# Patient Record
Sex: Male | Born: 1960 | Race: Asian | Hispanic: No | Marital: Married | Smoking: Never smoker
Health system: Southern US, Community
[De-identification: ages and names within clinical notes are randomized; demographics above are authoritative.]

## PROBLEM LIST (undated history)

## (undated) HISTORY — PX: APPENDECTOMY: SHX54

---

## 2015-01-16 ENCOUNTER — Encounter (HOSPITAL_COMMUNITY): Payer: Self-pay | Admitting: Emergency Medicine

## 2015-01-16 ENCOUNTER — Emergency Department (INDEPENDENT_AMBULATORY_CARE_PROVIDER_SITE_OTHER): Payer: PRIVATE HEALTH INSURANCE

## 2015-01-16 ENCOUNTER — Emergency Department (INDEPENDENT_AMBULATORY_CARE_PROVIDER_SITE_OTHER)
Admission: EM | Admit: 2015-01-16 | Discharge: 2015-01-16 | Disposition: A | Discharge status: Home / Self Care | Payer: PRIVATE HEALTH INSURANCE | Source: Home / Self Care | Attending: Emergency Medicine | Admitting: Emergency Medicine

## 2015-01-16 DIAGNOSIS — B349 Viral infection, unspecified: Secondary | ICD-10-CM | POA: Diagnosis not present

## 2015-01-16 LAB — POCT RAPID STREP A: STREPTOCOCCUS, GROUP A SCREEN (DIRECT): NEGATIVE

## 2015-01-16 MED ORDER — HYDROCODONE-HOMATROPINE 5-1.5 MG/5ML PO SYRP: 5.0000 mL | ORAL_SOLUTION | Freq: Every evening | ORAL | Status: DC | PRN

## 2015-01-16 NOTE — ED Provider Notes (Signed)
CSN: [id number]     Arrival date & time 01/16/15  1313 History   First MD Initiated Contact with Patient 01/16/15 1419     Chief Complaint  Patient presents with  . Sore Throat  . Generalized Body Aches  . Headache  . Cough   (Consider location/radiation/quality/duration/timing/severity/associated sxs/prior Treatment) HPI  He is a 54 year old man here for evaluation of cough and sore throat. He states his symptoms started about 3 days ago. He reports subjective fevers at night. He also describes a sore throat, cough, headache, and body aches. He denies any shortness of breath. His appetite is good. No nausea or vomiting. He has tried taking Aleve for the sore throat without improvement. No history of asthma or allergies.  History reviewed. No pertinent past medical history. Past Surgical History  Procedure Laterality Date  . Appendectomy     History reviewed. No pertinent family history. Social History  Substance Use Topics  . Smoking status: Never Smoker   . Smokeless tobacco: Never Used  . Alcohol Use: No    Review of Systems As in history of present illness. Allergies  Review of patient's allergies indicates no known allergies.  Home Medications   Prior to Admission medications   Medication Sig Start Date End Date Taking? Authorizing Provider  HYDROcodone-homatropine (HYCODAN) 5-1.5 MG/5ML syrup Take 5 mLs by mouth at bedtime as needed for cough. 01/16/15   Charm Rings, MD  Naproxen Sodium (ALEVE PO) Take by mouth.   Yes Historical Provider, MD   Meds Ordered and Administered this Visit  Medications - No data to display  BP 149/88 mmHg  Pulse 101  Temp(Src) 98.9 F (37.2 C) (Oral)  Resp 24  SpO2 98% No data found.   Physical Exam  Constitutional: He is oriented to person, place, and time. He appears well-developed and well-nourished. No distress.  HENT:  Nose: Nose normal.  Mouth/Throat: No oropharyngeal exudate.  Mild erythema of posterior pharynx  Eyes:  Conjunctivae are normal.  Neck: Neck supple.  Cardiovascular: Normal rate, regular rhythm and normal heart sounds.   No murmur heard. Pulmonary/Chest: Effort normal and breath sounds normal. No respiratory distress. He has no wheezes. He has no rales.  Lymphadenopathy:    He has no cervical adenopathy.  Neurological: He is alert and oriented to person, place, and time.    ED Course  Procedures (including critical care time)  Labs Review Labs Reviewed  POCT RAPID STREP A    Imaging Review Dg Chest 2 View  01/16/2015   CLINICAL DATA:  Cough, sore throat, body aches, and subjective fever for 3 days.  EXAM: CHEST  2 VIEW  COMPARISON:  None.  FINDINGS: Cardiac silhouette is upper limits of normal in size. The lungs are hypoinflated with slight elevation of the right hemidiaphragm. Minimal bibasilar atelectasis is present. No segmental airspace consolidation, edema, pleural effusion, or pneumothorax is identified. Mild thoracic spondylosis is noted.  IMPRESSION: Hypoinflation with minimal bibasilar atelectasis.   Electronically Signed   By: Sebastian Ache M.D.   On: 01/16/2015 14:53      MDM   1. Viral illness    No evidence of pneumonia on x-ray. This is likely a flulike illness. Symptomatic treatment with honey and fluids. Prescription given for Hycodan to use for cough. Follow-up if not improving in 3 days.    Charm Rings, MD 01/16/15 [phone]

## 2015-01-16 NOTE — Discharge Instructions (Signed)
You have a viral illness. This should improve over the next 3 days. Make sure you are drinking plenty of fluids. Tea with honey will help the cough during the day. You can take Hycodan at bedtime for the cough. Do not drive all taking this medicine. If you are not getting better by Saturday, please come back.

## 2015-01-16 NOTE — ED Notes (Signed)
Pt has been suffering from a sore throat, body aches, headache and cough for three days.  Pt states he has had a fever at home, but has not taken his temperature.  He reports the cough to be much worse at night.

## 2015-01-18 LAB — CULTURE, GROUP A STREP: Strep A Culture: NEGATIVE

## 2015-01-22 NOTE — ED Notes (Signed)
Final report of strep testing negative, no further action required 

## 2015-02-05 ENCOUNTER — Ambulatory Visit (INDEPENDENT_AMBULATORY_CARE_PROVIDER_SITE_OTHER): Payer: PRIVATE HEALTH INSURANCE | Admitting: Family Medicine

## 2015-02-05 VITALS — BP 130/80 | HR 91 | Temp 98.8°F | Resp 16 | Ht 68.0 in | Wt 213.0 lb

## 2015-02-05 DIAGNOSIS — Z23 Encounter for immunization: Secondary | ICD-10-CM | POA: Diagnosis not present

## 2015-02-05 DIAGNOSIS — Z0000 Encounter for general adult medical examination without abnormal findings: Secondary | ICD-10-CM

## 2015-02-05 DIAGNOSIS — R05 Cough: Secondary | ICD-10-CM | POA: Diagnosis not present

## 2015-02-05 DIAGNOSIS — Z1211 Encounter for screening for malignant neoplasm of colon: Secondary | ICD-10-CM

## 2015-02-05 DIAGNOSIS — R058 Other specified cough: Secondary | ICD-10-CM

## 2015-02-05 LAB — CBC
HCT: 47.4 % (ref 39.0–52.0)
Hemoglobin: 15.9 g/dL (ref 13.0–17.0)
MCH: 26.9 pg (ref 26.0–34.0)
MCHC: 33.5 g/dL (ref 30.0–36.0)
MCV: 80.1 fL (ref 78.0–100.0)
MPV: 11.2 fL (ref 8.6–12.4)
PLATELETS: 233 10*3/uL (ref 150–400)
RBC: 5.92 MIL/uL — AB (ref 4.22–5.81)
RDW: 14.8 % (ref 11.5–15.5)
WBC: 8.1 10*3/uL (ref 4.0–10.5)

## 2015-02-05 LAB — COMPREHENSIVE METABOLIC PANEL
ALT: 23 U/L (ref 9–46)
AST: 17 U/L (ref 10–35)
Albumin: 4.3 g/dL (ref 3.6–5.1)
Alkaline Phosphatase: 72 U/L (ref 40–115)
BUN: 18 mg/dL (ref 7–25)
CALCIUM: 9.5 mg/dL (ref 8.6–10.3)
CHLORIDE: 103 mmol/L (ref 98–110)
CO2: 26 mmol/L (ref 20–31)
Creat: 0.98 mg/dL (ref 0.70–1.33)
GLUCOSE: 80 mg/dL (ref 65–99)
POTASSIUM: 4.1 mmol/L (ref 3.5–5.3)
Sodium: 138 mmol/L (ref 135–146)
Total Bilirubin: 0.3 mg/dL (ref 0.2–1.2)
Total Protein: 7.5 g/dL (ref 6.1–8.1)

## 2015-02-05 LAB — LIPID PANEL
CHOL/HDL RATIO: 4 ratio (ref <=5.0)
Cholesterol: 226 mg/dL — ABNORMAL HIGH (ref 125–200)
HDL: 56 mg/dL (ref >=40)
LDL CALC: 112 mg/dL (ref <130)
Triglycerides: 291 mg/dL — ABNORMAL HIGH (ref <150)
VLDL: 58 mg/dL — ABNORMAL HIGH (ref <30)

## 2015-02-05 LAB — IFOBT (OCCULT BLOOD): IFOBT: NEGATIVE

## 2015-02-05 NOTE — Progress Notes (Deleted)
   Subjective:    Patient ID: Lance [name], male    DOB: [birth year], 54 y.o.   MRN: [id number]  HPI    Review of Systems     Objective:   Physical Exam        Assessment & Plan:

## 2015-02-05 NOTE — Patient Instructions (Signed)
If the cough does not get doing better, please return  I'll let you know the results of your laboratory testing in a few days.  We will make a referral for a screening colonoscopy to check your intestines. Someone will call you about the referral appointment.  Return at anytime if problems arise.  Return in one year for an annual physical examination.

## 2015-02-05 NOTE — Progress Notes (Signed)
Physical examination:  History: 54 year old gentleman who is here for a annual physical examination. He has generally been healthy, but knew it was time to get a checkup.  Past medical history: Medications: None Allergies: None Past medical illnesses: None Past surgical: Appendectomy many years ago  Family history: Both parents are deceased. His father had been beaten and imprisoned, and when he got out of prison after the war he died shortly thereafter. He has 2 brothers and 5 sisters. No major familial disease. He has 2 children.  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 He does not do regular exercise. He does not smoke, drink, or use drugs. He is married. He last went back to [place] 3 years ago.  Review of systems: Constitutional: Unremarkable. When he gets sweaty from working in the heat it bothers him, but this is not excessive. HEENT: Unremarkable Eyes: Wears reading glasses Respiratory: He was treated for a cough that he has had since September 7. He had an upper respiratory infection and went to [facility] urgent care for that. Chest x-ray was normal. He is gradually improving. He had some chest wall pain when he was coughing previously. Cardiovascular: Unremarkable Gastrointestinal: Unremarkable Endocrine: Unremarkable Genitourinary: Unremarkable Musculoskeletal: Unremarkable Dermatologic: Unremarkable Allergy/immunology: Unremarkable Neurological: Unremarkable Hematologic: Unremarkable Psychiatric: Unremarkable  Physical examination: Healthy-appearing man in no acute distress. Mildly overweight. His TMs are normal. Eyes PERRLA. Fundi benign. Throat clear. Teeth good. Neck supple without nodes or thyromegaly. No carotid bruits. Chest is clear to auscultation. Heart regular without murmurs gallops or arrhythmias. No axillary or inguinal nodes. Abdomen soft without organomegaly, masses, or tenderness. Normal male external genitalia, uncircumcised, testes descended. No  hernias. Digital rectal exam reveals prostate gland to be normal in shape and size and texture. Musculoskeletal unremarkable. Dermatologic unremarkable. Good pedal pulses.  Assessment: Normal physical examination Postviral cough, improving  Plan: Check screening labs Referral for a screening colonoscopy Return annually or as needed

## 2015-02-06 LAB — PSA: PSA: 2.07 ng/mL (ref <=4.00)

## 2015-02-06 LAB — HEMOGLOBIN A1C
HEMOGLOBIN A1C: 6.2 % — AB (ref <5.7)
Mean Plasma Glucose: 131 mg/dL — ABNORMAL HIGH (ref <117)

## 2017-08-30 ENCOUNTER — Ambulatory Visit: Payer: Self-pay

## 2017-08-30 NOTE — Telephone Encounter (Signed)
Pt. C/o neck pain with left sided pain and weakness x 7 months. States sometimes he "drops things." After standing for a hour, he will have pain in his heel. Denies any injury to his neck. Appointment scheduled for tomorrow.  Reason for Disposition . Numbness in an arm or hand (i.e., loss of sensation)  Answer Assessment - Initial Assessment Questions 1. ONSET: "When did the pain begin?"      7 months ago 2. LOCATION: "Where does it hurt?"      Weakness left side from neck down to foot 3. PATTERN "Does the pain come and go, or has it been constant since it started?"      Left foot heel pain is constant 4. SEVERITY: "How bad is the pain?"  (Scale 1-10; or mild, moderate, severe)   - MILD (1-3): doesn't interfere with normal activities    - MODERATE (4-7): interferes with normal activities or awakens from sleep    - SEVERE (8-10):  excruciating pain, unable to do any normal activities      9 5. RADIATION: "Does the pain go anywhere else, shoot into your arms?"     Sometimes will drop things from left hand 6. CORD SYMPTOMS: "Any weakness or numbness of the arms or legs?"     Left side 7. CAUSE: "What do you think is causing the neck pain?"     Unsure 8. NECK OVERUSE: "Any recent activities that involved turning or twisting the neck?"     No 9. OTHER SYMPTOMS: "Do you have any other symptoms?" (e.g., headache, fever, chest pain, difficulty breathing, neck swelling)     Sometimes a headache 10. PREGNANCY: "Is there any chance you are pregnant?" "When was your last menstrual period?"       No  Protocols used: NECK PAIN OR STIFFNESS-A-AH

## 2017-08-31 ENCOUNTER — Encounter: Payer: Self-pay | Admitting: Emergency Medicine

## 2017-08-31 ENCOUNTER — Ambulatory Visit (INDEPENDENT_AMBULATORY_CARE_PROVIDER_SITE_OTHER): Payer: PRIVATE HEALTH INSURANCE | Admitting: Emergency Medicine

## 2017-08-31 ENCOUNTER — Ambulatory Visit (INDEPENDENT_AMBULATORY_CARE_PROVIDER_SITE_OTHER): Payer: Self-pay

## 2017-08-31 ENCOUNTER — Other Ambulatory Visit: Payer: Self-pay

## 2017-08-31 VITALS — BP 120/88 | HR 82 | Temp 98.7°F | Resp 16 | Ht 67.25 in | Wt 205.0 lb

## 2017-08-31 DIAGNOSIS — M25512 Pain in left shoulder: Secondary | ICD-10-CM | POA: Insufficient documentation

## 2017-08-31 DIAGNOSIS — M79642 Pain in left hand: Secondary | ICD-10-CM | POA: Insufficient documentation

## 2017-08-31 DIAGNOSIS — M79672 Pain in left foot: Secondary | ICD-10-CM

## 2017-08-31 DIAGNOSIS — M722 Plantar fascial fibromatosis: Secondary | ICD-10-CM | POA: Insufficient documentation

## 2017-08-31 DIAGNOSIS — M25562 Pain in left knee: Secondary | ICD-10-CM

## 2017-08-31 DIAGNOSIS — M25552 Pain in left hip: Secondary | ICD-10-CM | POA: Insufficient documentation

## 2017-08-31 MED ORDER — DICLOFENAC SODIUM 75 MG PO TBEC: 75.0000 mg | DELAYED_RELEASE_TABLET | Freq: Two times a day (BID) | ORAL | 0 refills | Status: AC

## 2017-08-31 NOTE — Patient Instructions (Addendum)
IF you received an x-ray today, you will receive an invoice from [facility]. Please contact [facility] at [phone] with questions or concerns regarding your invoice.   IF you received labwork today, you will receive an invoice from [place]. Please contact [facility] at [phone] with questions or concerns regarding your invoice.   Our billing staff will not be able to assist you with questions regarding bills from these companies.  You will be contacted with the lab results as soon as they are available. The fastest way to get your results is to activate your My Chart account. Instructions are located on the last page of this paperwork. If you have not heard from [place] regarding the results in 2 weeks, please contact this office.     Plantar Fasciitis Plantar fasciitis is a painful foot condition that affects the heel. It occurs when the band of tissue that connects the toes to the heel bone (plantar fascia) becomes irritated. This can happen after exercising too much or doing other repetitive activities (overuse injury). The pain from plantar fasciitis can range from mild irritation to severe pain that makes it difficult for you to walk or move. The pain is usually worse in the morning or after you have been sitting or lying down for a while. What are the causes? This condition may be caused by:  Standing for long periods of time.  Wearing shoes that do not fit.  Doing high-impact activities, including running, aerobics, and ballet.  Being overweight.  Having an abnormal way of walking (gait).  Having tight calf muscles.  Having high arches in your feet.  Starting a new athletic activity.  What are the signs or symptoms? The main symptom of this condition is heel pain. Other symptoms include:  Pain that gets worse after activity or exercise.  Pain that is worse in the morning or after resting.  Pain that goes away after you walk for a few  minutes.  How is this diagnosed? This condition may be diagnosed based on your signs and symptoms. Your health care provider will also do a physical exam to check for:  A tender area on the bottom of your foot.  A high arch in your foot.  Pain when you move your foot.  Difficulty moving your foot.  You may also need to have imaging studies to confirm the diagnosis. These can include:  X-rays.  Ultrasound.  MRI.  How is this treated? Treatment for plantar fasciitis depends on the severity of the condition. Your treatment may include:  Rest, ice, and over-the-counter pain medicines to manage your pain.  Exercises to stretch your calves and your plantar fascia.  A splint that holds your foot in a stretched, upward position while you sleep (night splint).  Physical therapy to relieve symptoms and prevent problems in the future.  Cortisone injections to relieve severe pain.  Extracorporeal shock wave therapy (ESWT) to stimulate damaged plantar fascia with electrical impulses. It is often used as a last resort before surgery.  Surgery, if other treatments have not worked after 12 months.  Follow these instructions at home:  Take medicines only as directed by your health care provider.  Avoid activities that cause pain.  Roll the bottom of your foot over a bag of ice or a bottle of cold water. Do this for 20 minutes, 3-4 times a day.  Perform simple stretches as directed by your health care provider.  Try wearing athletic shoes with air-sole or gel-sole  cushions or soft shoe inserts.  Wear a night splint while sleeping, if directed by your health care provider.  Keep all follow-up appointments with your health care provider. How is this prevented?  Do not perform exercises or activities that cause heel pain.  Consider finding low-impact activities if you continue to have problems.  Lose weight if you need to. The best way to prevent plantar fasciitis is to avoid  the activities that aggravate your plantar fascia. Contact a health care provider if:  Your symptoms do not go away after treatment with home care measures.  Your pain gets worse.  Your pain affects your ability to move or do your daily activities. This information is not intended to replace advice given to you by your health care provider. Make sure you discuss any questions you have with your health care provider. Document Released: 01/20/2001 Document Revised: 09/30/2015 Document Reviewed: 03/07/2014 Elsevier Interactive Patient Education  [employer]2018 [employer]

## 2017-08-31 NOTE — Progress Notes (Signed)
Lance [name] 57 y.o.   Chief Complaint  Patient presents with  . Hand Pain    LEFT x 6-7 months  . Foot Pain    LEFT x 6-7 months    HISTORY OF PRESENT ILLNESS: This is a 57 y.o. male complaining of pain to left foot, left knee, left hip, left hand, left wrist, and left shoulder for the past 6-7 months.  Denies injury.  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  Denies any neurological symptoms.  No other associated symptomatology.  HPI   Prior to Admission medications   Not on File    No Known Allergies  There are no active problems to display for this patient.   No past medical history on file.  Past Surgical History:  Procedure Laterality Date  . APPENDECTOMY      Social History   Socioeconomic History  . Marital status: Married    Spouse name: Not on file  . Number of children: Not on file  . Years of education: Not on file  . Highest education level: Not on file  Occupational History  . Not on file  Social Needs  . Financial resource strain: Not on file  . Food insecurity:    Worry: Not on file    Inability: Not on file  . Transportation needs:    Medical: Not on file    Non-medical: Not on file  Tobacco Use  . Smoking status: Never Smoker  . Smokeless tobacco: Never Used  Substance and Sexual Activity  . Alcohol use: No  . Drug use: No  . Sexual activity: Not on file  Lifestyle  . Physical activity:    Days per week: Not on file    Minutes per session: Not on file  . Stress: Not on file  Relationships  . Social connections:    Talks on phone: Not on file    Gets together: Not on file    Attends religious service: Not on file    Active member of club or organization: Not on file    Attends meetings of clubs or organizations: Not on file    Relationship status: Not on file  . Intimate partner violence:    Fear of current or ex partner: Not on file    Emotionally abused: Not on file    Physically abused:  Not on file    Forced sexual activity: Not on file  Other Topics Concern  . Not on file  Social History Narrative  . Not on file    No family history on file.   Review of Systems  Constitutional: Negative for chills, fever and weight loss.  HENT: Negative.  Negative for sore throat.   Eyes: Negative.  Negative for blurred vision and double vision.  Respiratory: Negative.  Negative for cough and shortness of breath.   Cardiovascular: Negative.  Negative for chest pain and palpitations.  Gastrointestinal: Negative.  Negative for abdominal pain, blood in stool, diarrhea, nausea and vomiting.  Genitourinary: Negative.  Negative for dysuria and hematuria.  Musculoskeletal: Positive for joint pain (As per history of present illness).  Skin: Negative.  Negative for rash.  Neurological: Negative.  Negative for dizziness, sensory change, focal weakness, weakness and headaches.  Endo/Heme/Allergies: Negative.   All other systems reviewed and are negative.   Vitals:   08/31/17 1021  BP: 120/88  Pulse: 82  Resp: 16  Temp: 98.7 F (37.1 C)  SpO2: 97%  Physical Exam  Constitutional: He is oriented to person, place, and time. He appears well-developed and well-nourished.  HENT:  Head: Normocephalic and atraumatic.  Eyes: Pupils are equal, round, and reactive to light. EOM are normal.  Neck: Normal range of motion. Neck supple.  Cardiovascular: Normal rate and regular rhythm.  Pulmonary/Chest: Effort normal and breath sounds normal.  Musculoskeletal:  Left lower extremity: 1 left hip: Mild tenderness.  #2 left knee: Mild swelling with mild tenderness.  #3 left foot: Tender heel.  NVI. Left upper extremity: #1 hand: Within normal limits #2 wrist: Full range of motion and nontender.  #3 Elbow: Within normal limits and full range of motion.  #4 shoulder: Some tenderness to the trapezius muscle but still has full range of motion. Rest of the extremities: Within normal limits.    Neurological: He is alert and oriented to person, place, and time. No sensory deficit. He exhibits normal muscle tone. Coordination normal.  Skin: Skin is warm and dry. Capillary refill takes less than 2 seconds. No rash noted.  Psychiatric: He has a normal mood and affect. His behavior is normal.  Vitals reviewed.  Dg Knee 1-2 Views Left  Result Date: 08/31/2017 CLINICAL DATA:  Left knee pain, no known injury. EXAM: LEFT KNEE - 1-2 VIEW COMPARISON:  None in PACs FINDINGS: The bones are subjectively adequately mineralized. The joint spaces are well maintained. There is mild beaking of the medial tibial spine. There is no acute or healing fracture. There is no suprapatellar effusion. There is mild soft tissue density in the popliteal fossa that may reflect a popliteal cyst. IMPRESSION: There is no acute or significant chronic bony abnormality of the left knee. If there are clinical findings that might suggest a popliteal cyst, ultrasound would be a useful next imaging step. Electronically Signed   By: David  [place] M.D.   On: 08/31/2017 11:12   Dg Foot 2 Views Left  Result Date: 08/31/2017 CLINICAL DATA:  Left foot pain EXAM: LEFT FOOT - 2 VIEW COMPARISON:  None in PACs FINDINGS: The site of the patient's symptoms is not noted in the clinical history. There is a large plantar calcaneal spur and there is calcification within the plantar aponeurosis. The bones are subjectively adequately mineralized. There is a sclerotic marginated focus in the base of the first metatarsal which likely reflects a bone cyst. There are degenerative changes of the first MTP joint with irregularity of the head of the first metatarsal. The other MTP joints are normal. The interphalangeal joints exhibit no significant abnormalities. IMPRESSION: Large plantar calcaneal spur with calcification within the plantar fascia. 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 No acute fracture or dislocation. Electronically Signed    By: David  [place] M.D.   On: 08/31/2017 11:14   Dg Hip Unilat W Or W/o Pelvis 2-3 Views Left  Result Date: 08/31/2017 CLINICAL DATA:  Left hip pain with no known injury. The patient does stand for prolonged periods. EXAM: DG HIP (WITH OR WITHOUT PELVIS) 2-3V LEFT COMPARISON:  None. FINDINGS: The bony pelvis is subjectively adequately mineralized. There is no acute fracture nor dislocation. AP and lateral views of the left hip reveal preservation of the joint space. The articular surfaces of the femoral head and acetabulum remains smoothly rounded. The femoral neck, intertrochanteric, and immediate subtrochanteric regions are normal. IMPRESSION: There is no acute or significant chronic bony abnormality of the left hip. Electronically Signed   By: David  [place] M.D.   On: 08/31/2017 11:12  ASSESSMENT & PLAN: Lillie was seen today for hand pain and foot pain.  Diagnoses and all orders for this visit:  Left foot pain -     DG Foot 2 Views Left; Future -     diclofenac (VOLTAREN) 75 MG EC tablet; Take 1 tablet (75 mg total) by mouth 2 (two) times daily for 5 days. -     Ambulatory referral to Podiatry  Acute pain of left knee -     DG Knee 1-2 Views Left; Future  Left hip pain -     DG HIP UNILAT W OR W/O PELVIS 2-3 VIEWS LEFT; Future  Left hand pain  Acute pain of left shoulder  Plantar fasciitis of left foot    Patient Instructions       IF you received an x-ray today, you will receive an invoice from [facility]. Please contact [facility] at [phone] with questions or concerns regarding your invoice.   IF you received labwork today, you will receive an invoice from [facility]. Please contact [facility] at [phone] with questions or concerns regarding your invoice.   Our billing staff will not be able to assist you with questions regarding bills from these companies.  You will be contacted with the lab results as soon as they are available. The  fastest way to get your results is to activate your My Chart account. Instructions are located on the last page of this paperwork. If you have not heard from [place] regarding the results in 2 weeks, please contact this office.     Plantar Fasciitis Plantar fasciitis is a painful foot condition that affects the heel. It occurs when the band of tissue that connects the toes to the heel bone (plantar fascia) becomes irritated. This can happen after exercising too much or doing other repetitive activities (overuse injury). The pain from plantar fasciitis can range from mild irritation to severe pain that makes it difficult for you to walk or move. The pain is usually worse in the morning or after you have been sitting or lying down for a while. What are the causes? This condition may be caused by:  Standing for long periods of time.  Wearing shoes that do not fit.  Doing high-impact activities, including running, aerobics, and ballet.  Being overweight.  Having an abnormal way of walking (gait).  Having tight calf muscles.  Having high arches in your feet.  Starting a new athletic activity.  What are the signs or symptoms? The main symptom of this condition is heel pain. Other symptoms include:  Pain that gets worse after activity or exercise.  Pain that is worse in the morning or after resting.  Pain that goes away after you walk for a few minutes.  How is this diagnosed? This condition may be diagnosed based on your signs and symptoms. Your health care provider will also do a physical exam to check for:  A tender area on the bottom of your foot.  A high arch in your foot.  Pain when you move your foot.  Difficulty moving your foot.  You may also need to have imaging studies to confirm the diagnosis. These can include:  X-rays.  Ultrasound.  MRI.  How is this treated? Treatment for plantar fasciitis depends on the severity of the condition. Your treatment may  include:  Rest, ice, and over-the-counter pain medicines to manage your pain.  Exercises to stretch your calves and your plantar fascia.  A splint that holds your foot in  a stretched, upward position while you sleep (night splint).  Physical therapy to relieve symptoms and prevent problems in the future.  Cortisone injections to relieve severe pain.  Extracorporeal shock wave therapy (ESWT) to stimulate damaged plantar fascia with electrical impulses. It is often used as a last resort before surgery.  Surgery, if other treatments have not worked after 12 months.  Follow these instructions at home:  Take medicines only as directed by your health care provider.  Avoid activities that cause pain.  Roll the bottom of your foot over a bag of ice or a bottle of cold water. Do this for 20 minutes, 3-4 times a day.  Perform simple stretches as directed by your health care provider.  Try wearing athletic shoes with air-sole or gel-sole cushions or soft shoe inserts.  Wear a night splint while sleeping, if directed by your health care provider.  Keep all follow-up appointments with your health care provider. How is this prevented?  Do not perform exercises or activities that cause heel pain.  Consider finding low-impact activities if you continue to have problems.  Lose weight if you need to. The best way to prevent plantar fasciitis is to avoid the activities that aggravate your plantar fascia. Contact a health care provider if:  Your symptoms do not go away after treatment with home care measures.  Your pain gets worse.  Your pain affects your ability to move or do your daily activities. This information is not intended to replace advice given to you by your health care provider. Make sure you discuss any questions you have with your health care provider. Document Released: 01/20/2001 Document Revised: 09/30/2015 Document Reviewed: 03/07/2014 Elsevier Interactive Patient  Education  2018 Elsevier Inc.      Edwina Barth, MD [facility]

## 2017-09-02 ENCOUNTER — Telehealth: Payer: Self-pay | Admitting: Emergency Medicine

## 2017-09-02 NOTE — Telephone Encounter (Signed)
Copied from CRM [id number]. Topic: Quick Communication - See Telephone Encounter >> Sep 02, 2017 11:33 AM Jolayne Hainesaylor, Brittany L wrote: CRM for notification. See Telephone encounter for: 09/02/17.  Patient would like the results of the xray from hip/knees & a copy of it. Please call back @ [phone]

## 2017-09-02 NOTE — Telephone Encounter (Signed)
Pt advised that it may take up to 2 weeks for provider to review labs.

## 2018-12-22 ENCOUNTER — Encounter (HOSPITAL_COMMUNITY): Payer: Self-pay | Admitting: Emergency Medicine

## 2018-12-22 ENCOUNTER — Emergency Department (HOSPITAL_COMMUNITY): Payer: PRIVATE HEALTH INSURANCE

## 2018-12-22 ENCOUNTER — Other Ambulatory Visit: Payer: Self-pay

## 2018-12-22 ENCOUNTER — Emergency Department (HOSPITAL_COMMUNITY)
Admission: EM | Admit: 2018-12-22 | Discharge: 2018-12-23 | Disposition: A | Discharge status: Home / Self Care | Payer: PRIVATE HEALTH INSURANCE | Attending: Emergency Medicine | Admitting: Emergency Medicine

## 2018-12-22 DIAGNOSIS — R0602 Shortness of breath: Secondary | ICD-10-CM | POA: Insufficient documentation

## 2018-12-22 DIAGNOSIS — R110 Nausea: Secondary | ICD-10-CM | POA: Diagnosis not present

## 2018-12-22 DIAGNOSIS — Z5321 Procedure and treatment not carried out due to patient leaving prior to being seen by health care provider: Secondary | ICD-10-CM | POA: Insufficient documentation

## 2018-12-22 NOTE — ED Triage Notes (Signed)
Patient reports nausea and SOB since this afternoon. Denies chest pain, fever, and cough.

## 2018-12-22 NOTE — ED Notes (Signed)
Pt states he is feeling better and does not want to wait any longer

## 2019-05-23 ENCOUNTER — Ambulatory Visit: Payer: Self-pay | Admitting: Podiatry

## 2019-06-08 ENCOUNTER — Encounter: Payer: Self-pay | Admitting: Podiatry

## 2019-06-08 ENCOUNTER — Ambulatory Visit: Payer: BC Managed Care – PPO | Admitting: Podiatry

## 2019-06-08 ENCOUNTER — Other Ambulatory Visit: Payer: Self-pay

## 2019-06-08 ENCOUNTER — Ambulatory Visit (INDEPENDENT_AMBULATORY_CARE_PROVIDER_SITE_OTHER): Payer: BC Managed Care – PPO

## 2019-06-08 VITALS — BP 152/105 | HR 96 | Resp 16

## 2019-06-08 DIAGNOSIS — M722 Plantar fascial fibromatosis: Secondary | ICD-10-CM

## 2019-06-08 NOTE — Progress Notes (Signed)
  Subjective:  Patient ID: Lance [name], male    DOB: [birth year],  MRN: [id number] HPI Chief Complaint  Patient presents with  . Foot Pain    Plantar heel left - aching over 1 year, worse after on it awhile, some AM pain, PCP Rx'd meloxicam  . New Patient (Initial Visit)    59 y.o. male presents with the above complaint.   ROS: Denies fever chills nausea vomiting muscle aches pains calf pain back pain chest pain shortness of breath.  No past medical history on file. Past Surgical History:  Procedure Laterality Date  . APPENDECTOMY      Current Outpatient Medications:  .  HYDROcodone-acetaminophen (NORCO/VICODIN) 5-325 MG tablet, , Disp: , Rfl:  .  meloxicam (MOBIC) 7.5 MG tablet, , Disp: , Rfl:  .  pantoprazole (PROTONIX) 20 MG tablet, , Disp: , Rfl:  .  predniSONE (STERAPRED UNI-PAK 21 TAB) 10 MG (21) TBPK tablet, , Disp: , Rfl:   No Known Allergies Review of Systems Objective:   Vitals:   06/08/19 1115  BP: (!) 152/105  Pulse: 96  Resp: 16    General: Well developed, nourished, in no acute distress, alert and oriented x3   Dermatological: Skin is warm, dry and supple bilateral. Nails x 10 are well maintained; remaining integument appears unremarkable at this time. There are no open sores, no preulcerative lesions, no rash or signs of infection present.  Vascular: Dorsalis Pedis artery and Posterior Tibial artery pedal pulses are 2/4 bilateral with immedate capillary fill time. Pedal hair growth present. No varicosities and no lower extremity edema present bilateral.   Neruologic: Grossly intact via light touch bilateral. Vibratory intact via tuning fork bilateral. Protective threshold with Semmes Wienstein monofilament intact to all pedal sites bilateral. Patellar and Achilles deep tendon reflexes 2+ bilateral. No Babinski or clonus noted bilateral.   Musculoskeletal: No gross boney pedal deformities bilateral. No pain, crepitus, or limitation noted with foot  and ankle range of motion bilateral. Muscular strength 5/5 in all groups tested bilateral.  Pain on palpation mid calcaneal tubercle of the left heel.  Gait: Unassisted, Nonantalgic.    Radiographs:  Radiographs taken today demonstrate a plantar distally oriented calcaneal heel spur with some calcification of the plantar fascia at that region.  Otherwise there is no significant abnormalities.  Soft tissue increase in density at the plantar calcaneal insertion site is noted.  Assessment & Plan:   Assessment: Plantar fasciitis left.  Longstanding.    Plan: Discussed etiology pathology conservative surgical therapies at this point time went ahead and performed a injection to his left heel 20 mg Kenalog 5 mg of Marcaine.  Dispensed plantar fascial brace and a night splint.  Discussed appropriate shoe gear stretching exercises and ice therapy.  He is currently taking his second day of a 10-day course of prednisone and he already has 7-1/2 mg of meloxicam prescribed.  I will follow-up with him in 1 month     Vernette Moise T. [place], [place]

## 2019-06-08 NOTE — Patient Instructions (Signed)

## 2019-06-16 ENCOUNTER — Other Ambulatory Visit: Payer: Self-pay | Admitting: Podiatry

## 2019-08-17 ENCOUNTER — Telehealth: Payer: Self-pay | Admitting: Podiatry

## 2019-08-17 NOTE — Telephone Encounter (Signed)
I have a question about my bill. My guarantor number is [phone]. Please call me back. Thank you.

## 2019-09-06 ENCOUNTER — Telehealth: Payer: Self-pay | Admitting: Podiatry

## 2019-09-06 NOTE — Telephone Encounter (Signed)
Pt called stating his left heel pain has returned and he requested to speak to the doctor over the phone. I tried to schedule the pt an appointment but he declined due to COVID-19.

## 2019-09-06 NOTE — Telephone Encounter (Signed)
Left message encouraging pt to make an appt to be evaluated and to discuss treatment options with Dr. Al Corpus, and to add ice therapy 3-4 times daily for 15-20 minute/session protecting the skin of the foot from the ice with a light cloth, and to call for an appt [phone] or me with questions [phone].

## 2020-05-16 ENCOUNTER — Other Ambulatory Visit: Payer: Self-pay | Admitting: Nurse Practitioner

## 2020-05-16 ENCOUNTER — Ambulatory Visit
Admission: RE | Admit: 2020-05-16 | Discharge: 2020-05-16 | Disposition: A | Discharge status: Home / Self Care | Payer: No Typology Code available for payment source | Source: Ambulatory Visit | Attending: Nurse Practitioner | Admitting: Nurse Practitioner

## 2020-05-16 ENCOUNTER — Other Ambulatory Visit: Payer: Self-pay

## 2020-05-16 DIAGNOSIS — M549 Dorsalgia, unspecified: Secondary | ICD-10-CM

## 2020-05-30 ENCOUNTER — Ambulatory Visit: Payer: BC Managed Care – PPO | Admitting: Podiatry

## 2021-05-26 ENCOUNTER — Ambulatory Visit (INDEPENDENT_AMBULATORY_CARE_PROVIDER_SITE_OTHER): Payer: Managed Care, Other (non HMO) | Admitting: Podiatry

## 2021-05-26 ENCOUNTER — Other Ambulatory Visit: Payer: Self-pay

## 2021-05-26 DIAGNOSIS — M722 Plantar fascial fibromatosis: Secondary | ICD-10-CM

## 2021-05-26 MED ORDER — METHYLPREDNISOLONE 4 MG PO TBPK
ORAL_TABLET | ORAL | 0 refills | Status: DC
Start: 2021-05-26 — End: 2021-12-02

## 2021-05-26 MED ORDER — MELOXICAM 15 MG PO TABS
15.0000 mg | ORAL_TABLET | Freq: Every day | ORAL | 3 refills | Status: DC
Start: 2021-05-26 — End: 2021-09-09

## 2021-05-26 NOTE — Patient Instructions (Signed)

## 2021-05-28 ENCOUNTER — Other Ambulatory Visit: Payer: Self-pay | Admitting: Family Medicine

## 2021-05-28 NOTE — Progress Notes (Signed)
°  Subjective:  Patient ID: Lance [name], male    DOB: [birth year],  MRN: [id number]  Chief Complaint  Patient presents with   Foot Pain     L problem with bone on bottom of foot , can not stand , pain ( pt of Dr Al Corpus)      61 y.o. male presents with the above complaint. History confirmed with patient.  Previously has seen Dr. Al Corpus for with for plantar fasciitis.  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  His right foot is starting to hurt as well  Objective:  Physical Exam: warm, good capillary refill, no trophic changes or ulcerative lesions, normal DP and PT pulses, and normal sensory exam. Left Foot: point tenderness of the mid plantar fascia Right Foot: point tenderness of the mid plantar fascia  Assessment:   1. Plantar fasciitis      Plan:  Patient was evaluated and treated and all questions answered.  Discussed the etiology and treatment options for plantar fasciitis including stretching, formal physical therapy, supportive shoegears such as a running shoe or sneaker, pre fabricated orthoses, injection therapy, and oral medications. We also discussed the role of surgical treatment of this for patients who do not improve after exhausting non-surgical treatment options.   -Educated patient on stretching and icing of the affected limb -Rx for meloxicam. Educated on use, risks and benefits of the medication -Rx for medrol pack. Educated on use, risks, and benefits of the medication -Physical therapy referral sent for [facility]   No follow-ups on file.

## 2021-06-01 NOTE — Progress Notes (Deleted)
°  Subjective:    Lance [name] - 61 y.o. male MRN [id number]  Date of birth: 1960-07-04  HPI  Lance [name] is to establish care.   Current issues and/or concerns:  ROS per HPI     Health Maintenance:  Health Maintenance Due  Topic Date Due   HIV Screening  Never done   Hepatitis C Screening  Never done   TETANUS/TDAP  Never done   COLONOSCOPY (Pts 45-71yrs Insurance coverage will need to be confirmed)  Never done   Zoster Vaccines- Shingrix (1 of 2) Never done   INFLUENZA VACCINE  12/09/2020     Past Medical History: Patient Active Problem List   Diagnosis Date Noted   Left foot pain 08/31/2017   Acute pain of left knee 08/31/2017   Left hip pain 08/31/2017   Left hand pain 08/31/2017   Acute pain of left shoulder 08/31/2017   Plantar fasciitis of left foot 08/31/2017      Social History   reports that he has never smoked. He has never used smokeless tobacco. He reports that he does not drink alcohol and does not use drugs.   Family History  family history is not on file.   Medications: reviewed and updated   Objective:   Physical Exam There were no vitals taken for this visit. Physical Exam      Assessment & Plan:         Patient was given clear instructions to go to Emergency Department or return to medical center if symptoms don't improve, worsen, or new problems develop.The patient verbalized understanding.  I discussed the assessment and treatment plan with the patient. The patient was provided an opportunity to ask questions and all were answered. The patient agreed with the plan and demonstrated an understanding of the instructions.   The patient was advised to call back or seek an in-person evaluation if the symptoms worsen or if the condition fails to improve as anticipated.    [name], NP 06/01/2021, 11:08 AM Primary Care at [facility]

## 2021-06-03 NOTE — Therapy (Signed)
OUTPATIENT PHYSICAL THERAPY LOWER EXTREMITY EVALUATION   Patient Name: Lance [name] MRN: [id number] DOB:1961-02-06, 61 y.o., male Today's Date: 06/12/2021   PT End of Session - 06/12/21 1139     Visit Number 1    Number of Visits 16    Date for PT Re-Evaluation 08/07/21    Authorization Type [employer]    PT Start Time 1048    PT Stop Time 1130    PT Time Calculation (min) 42 min             History reviewed. No pertinent past medical history. Past Surgical History:  Procedure Laterality Date   APPENDECTOMY     Patient Active Problem List   Diagnosis Date Noted   Left foot pain 08/31/2017   Acute pain of left knee 08/31/2017   Left hip pain 08/31/2017   Left hand pain 08/31/2017   Acute pain of left shoulder 08/31/2017   Plantar fasciitis of left foot 08/31/2017    PCP: Trey Sailors, PA  REFERRING PROVIDER: Criselda Peaches, DPM  REFERRING DIAG: Referral diagnosis: Plantar fasciitis [M72.2]  THERAPY DIAG:  Pain in left foot  Pain in right foot  Muscle weakness  Unsteadiness on feet  ONSET DATE: 2017  SUBJECTIVE:                                                                                                                                                                                           Lance [name] is a 61 y.o. male who presents to clinic with chief complaint of L heel pain.  MOI/History of condition:  Started in 2017.  At times his L hip hurts.  Has "first step" sign.  He has never had PT before.  He has had an injection which helped for about 2 months.  From referring provider:   "61 y.o. male presents with the above complaint. History confirmed with patient.  Previously has seen Dr. Milinda Pointer for with for plantar fasciitis.  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  His right foot is starting to hurt as well   Objective:  Physical Exam: warm, good capillary refill, no trophic changes or  ulcerative lesions, normal DP and PT pulses, and normal sensory exam. Left Foot: point tenderness of the mid plantar fascia Right Foot: point tenderness of the mid plantar fascia"   Red flags:  denies   Pertinent past history:  none  Pain:  Are you having pain? Yes Pain location: L heel NPRS scale:  highest 9/10 current 5/10  best 3/10 Aggravating factors: standing (2 hours to get  to high level) Relieving factors: sitting, ice, rest Pain description: sharp Severity: high Irritability: moderate Stage: Chronic Stability: staying the same 24 hour pattern: worst first thing in the morning then gets better, then worse throughout day.   Occupation: Training and development officer - currently unable to work d/t pain  Hobbies/Recreation: na  Assistive Device: na  Hand Dominance: na  Patient Goals: reduce pain be able to return to work   PRECAUTIONS: None  WEIGHT BEARING RESTRICTIONS No  FALLS:  Has patient fallen in last 6 months? No, Number of falls: 0  LIVING ENVIRONMENT: Lives with: lives alone Stairs: No;   PLOF: Independent  DIAGNOSTIC FINDINGS: NA   OBJECTIVE:    GENERAL OBSERVATION:   Pes planus bil, antalgic gait with reduced time in stance L  SENSATION:  Light touch: Appears intact  PALPATION: TTP bil plantar calcaneal insertion of PF, NTTP achilles insertion   LE MMT:  MMT Right 06/12/2021 Left 06/12/2021  Hip flexion (L2, L3)    Knee extension (L3)    Knee flexion 3+ 3  Hip abduction 3+ 3+  Hip extension    Hip external rotation    Hip internal rotation    Hip adduction    Ankle dorsiflexion (L4)    Ankle plantarflexion (S1) 10x SLHR 6x SLHR  Ankle inversion    Ankle eversion    Great Toe ext (L5)     (Blank rows = not tested, score listed is out of 5 possible points.  N = WNL, D = diminished, C = clear for gross weakness with myotome testing, * = concordant pain with testing)  LE ROM:  ROM Right 06/12/2021 Left 06/12/2021  Hip flexion    Hip extension     Hip abduction    Hip adduction    Hip internal rotation    Hip external rotation    Knee flexion    Knee extension    Ankle dorsiflexion 40 cc 40 cc  Ankle plantarflexion    Ankle inversion    Ankle eversion     (Blank rows = not tested, N = WNL, * = concordant pain with testing)  FUNCTIONAL TESTS:  Progressive balance screen:  SLS: R 10'', L 10'' - very unstable   GAIT: Comments: antalgic gait, walking on toes  PATIENT SURVEYS:  FOTO 27 -> 53    TODAY'S TREATMENT: Creating, reviewing, and completing below HEP   PATIENT EDUCATION:  POC, diagnosis, prognosis, HEP, and outcome measures.  Pt educated via explanation, demonstration, and handout (HEP).  Pt confirms understanding verbally.    HOME EXERCISE PROGRAM: Access Code: [email or web address] URL: [email or web address] Date: 06/12/2021 Prepared by: Shearon Balo  Exercises Seated Plantar Fascia Stretch - 3 x daily - 7 x weekly - 3 sets - 10 reps Seated Plantar Fascia Mobilization with Small Ball - 3 x daily - 7 x weekly - 3 sets - 10 reps Seated Toe Towel Scrunches - 3 x daily - 7 x weekly - 3 sets - 10 reps   ASSESSMENT:  CLINICAL IMPRESSION: Lance [name] is a 61 y.o. male who presents to clinic with signs and sxs consistent with chronic bil L>R plantar fasciitis.  Significant pes planus bil.  No significant DF restriction  Patient presents with pain and impairments/deficits in: L calf and bil hip strength.  Activity limitations include: standing, walking.  Participation limitations include: work.  Patient will benefit from skilled therapy to address pain and the listed deficits in order to achieve functional goals, enable safety and independence in completion of daily  tasks, and return to PLOF.   REHAB POTENTIAL: Fair chronic  CLINICAL DECISION MAKING: Stable/uncomplicated  EVALUATION COMPLEXITY: Low   GOALS:  SHORT TERM GOALS:  STG Name Target Date Goal status  1 Lance [name] will be >75% HEP  compliant to improve carryover between sessions and facilitate independent management of condition  Baseline: No HEP 07/03/2021 INITIAL   LONG TERM GOALS:   LTG Name Target Date Goal status  1 Lance [name] will improve FOTO score from 27 (baseline) to 53 as a proxy for functional improvement 08/07/2021 INITIAL  2 Susumu will improve the following MMTs to >/= 4/5 to show improvement in strength:  hip abd and ext, 10x SL heel raise bil  Baseline: see chart in note 08/07/2021 INITIAL  3 Keith will report >/= 50% decrease in pain from evaluation   Baseline: 9/10 max pain 08/07/2021 INITIAL  4 Andrae will be able to return to work, not limited by pain  Baseline: limited by pain 08/07/2021 INITIAL  5 Corneilus will be able to stand for 4 hrs while at work, not limited by pain   Baseline: 2 hours 08/07/2021 INITIAL  6 Sigurd will be able to stand for >30'' in SLS stance, to show a significant improvement in balance in order to reduce fall risk   Baseline: 10'' with high level of instability 08/07/2021 INITIAL   PLAN: PT FREQUENCY: 1-2x/week  PT DURATION: 8 weeks (Ending 08/07/2021)  PLANNED INTERVENTIONS: Therapeutic exercises, Therapeutic activity, Neuro Muscular re-education, Gait training, Patient/Family education, Joint mobilization, Dry Needling, Electrical stimulation, Spinal mobilization and/or manipulation, Moist heat, Taping, Vasopneumatic device, Ionotophoresis 4mg /ml Dexamethasone, and Manual therapy  PLAN FOR NEXT SESSION: IASTM, PF loading, foot intrinsic strengthening, general LE strengthening, orthotics   Shearon Balo PT, DPT 06/12/2021, 11:41 AM

## 2021-06-04 ENCOUNTER — Ambulatory Visit: Payer: PRIVATE HEALTH INSURANCE | Admitting: Family

## 2021-06-04 DIAGNOSIS — Z7689 Persons encountering health services in other specified circumstances: Secondary | ICD-10-CM

## 2021-06-12 ENCOUNTER — Encounter: Payer: Self-pay | Admitting: Physical Therapy

## 2021-06-12 ENCOUNTER — Other Ambulatory Visit: Payer: Self-pay

## 2021-06-12 ENCOUNTER — Ambulatory Visit: Payer: 59 | Attending: Podiatry | Admitting: Physical Therapy

## 2021-06-12 DIAGNOSIS — M79671 Pain in right foot: Secondary | ICD-10-CM | POA: Diagnosis present

## 2021-06-12 DIAGNOSIS — R2681 Unsteadiness on feet: Secondary | ICD-10-CM

## 2021-06-12 DIAGNOSIS — M722 Plantar fascial fibromatosis: Secondary | ICD-10-CM | POA: Diagnosis not present

## 2021-06-12 DIAGNOSIS — M6281 Muscle weakness (generalized): Secondary | ICD-10-CM | POA: Diagnosis present

## 2021-06-12 DIAGNOSIS — M79672 Pain in left foot: Secondary | ICD-10-CM | POA: Diagnosis present

## 2021-06-17 ENCOUNTER — Ambulatory Visit: Payer: 59 | Admitting: Physical Therapy

## 2021-06-17 ENCOUNTER — Encounter: Payer: Self-pay | Admitting: Physical Therapy

## 2021-06-17 ENCOUNTER — Other Ambulatory Visit: Payer: Self-pay

## 2021-06-17 DIAGNOSIS — M79671 Pain in right foot: Secondary | ICD-10-CM

## 2021-06-17 DIAGNOSIS — M6281 Muscle weakness (generalized): Secondary | ICD-10-CM

## 2021-06-17 DIAGNOSIS — M79672 Pain in left foot: Secondary | ICD-10-CM

## 2021-06-17 DIAGNOSIS — R2681 Unsteadiness on feet: Secondary | ICD-10-CM

## 2021-06-17 NOTE — Therapy (Signed)
OUTPATIENT PHYSICAL THERAPY TREATMENT NOTE   Patient Name: Lance [name] MRN: [id number] DOB:Sep 23, 1960, 61 y.o., male Today's Date: 06/17/2021  PCP: Trey Sailors, [place] REFERRING PROVIDER: Trey Sailors, [place]   PT End of Session - 06/17/21 [phone]     Visit Number 2    Number of Visits 16    Date for PT Re-Evaluation 08/07/21    Authorization Type [employer]    PT Start Time 1000    PT Stop Time 1040    PT Time Calculation (min) 40 min             History reviewed. No pertinent past medical history. Past Surgical History:  Procedure Laterality Date   APPENDECTOMY     Patient Active Problem List   Diagnosis Date Noted   Left foot pain 08/31/2017   Acute pain of left knee 08/31/2017   Left hip pain 08/31/2017   Left hand pain 08/31/2017   Acute pain of left shoulder 08/31/2017   Plantar fasciitis of left foot 08/31/2017    REFERRING DIAG: Referral diagnosis: Plantar fasciitis [M72.2]  THERAPY DIAG:  Pain in left foot  Pain in right foot  Muscle weakness  Unsteadiness on feet  PERTINENT HISTORY: none  PRECAUTIONS/RESTRICTIONS:   none  SUBJECTIVE:  Pt reports he is still having some pain, but has been HEP compliant.  Pain:  Are you having pain? Yes Pain location: L heel NPRS scale:  current 5/10  Aggravating factors: standing (2 hours to get to high level) Relieving factors: sitting, ice, rest Pain description: sharp Severity: high Irritability: moderate Stage: Chronic Stability: staying the same 24 hour pattern: worst first thing in the morning then gets better, then worse throughout day.   OBJECTIVE:  LE MMT:   MMT Right 06/12/2021 Left 06/12/2021  Hip flexion (L2, L3)      Knee extension (L3)      Knee flexion 3+ 3  Hip abduction 3+ 3+  Hip extension      Hip external rotation      Hip internal rotation      Hip adduction      Ankle dorsiflexion (L4)      Ankle plantarflexion (S1) 10x SLHR 6x SLHR  Ankle  inversion      Ankle eversion      Great Toe ext (L5)        (Blank rows = not tested, score listed is out of 5 possible points.  N = WNL, D = diminished, C = clear for gross weakness with myotome testing, * = concordant pain with testing)   LE ROM:   ROM Right 06/12/2021 Left 06/12/2021  Hip flexion      Hip extension      Hip abduction      Hip adduction      Hip internal rotation      Hip external rotation      Knee flexion      Knee extension      Ankle dorsiflexion 40 cc 40 cc  Ankle plantarflexion      Ankle inversion      Ankle eversion        (Blank rows = not tested, N = WNL, * = concordant pain with testing)   TREATMENT:  Therapeutic Exercise: - bike - 5 min - L3 - while taking subjective - Slant board stretch 45''x3 - towel scrunch - sitting - S/L clam - 3x10 - GTB  Manual Therapy: - STM and IASTM plantar surface  L foot  Neuromuscular re-ed: - tandem on foam 45'' bouts - no shoes - PF/DF on blue rocker board - 20x  Patient Education: - HEP was updated and reissued to patient; pt educated on HEP, was provided handout, and verbally confirmed understanding of exercises.  HOME EXERCISE PROGRAM: Access Code: [email or web address] URL: [email or web address] Date: 06/17/2021 Prepared by: Shearon Balo  Exercises Seated Plantar Fascia Stretch - 3 x daily - 7 x weekly - 3 sets - 10 reps Seated Plantar Fascia Mobilization with Small Ball - 3 x daily - 7 x weekly - 3 sets - 10 reps Seated Toe Towel Scrunches - 3 x daily - 7 x weekly - 3 sets - 10 reps Clam with Resistance - 1 x daily - 7 x weekly - 3 sets - 10 reps      ASSESSMENT:   CLINICAL IMPRESSION: Lance [name] is progressing well with therapy.  Pt reports mild pain reduction following therapy.  Today we concentrated on hip strengthening, ankle strengthening, and balance/proprioception.  Pt with high level of fatigue in foot intrinsics, hip, and ankle with therapy.  Recommended he look at getting some  more supportive shoes; he is agreeable.  Pt will continue to benefit from skilled physical therapy to address remaining deficits and achieve listed goals.  Continue per POC.     GOALS:   SHORT TERM GOALS:   STG Name Target Date Goal status  1 Lance [name] will be >75% HEP compliant to improve carryover between sessions and facilitate independent management of condition   Baseline: No HEP 07/03/2021 MET 2/7    LONG TERM GOALS:    LTG Name Target Date Goal status  1 Lance [name] will improve FOTO score from 27 (baseline) to 53 as a proxy for functional improvement 08/07/2021 INITIAL  2 Lance [name] will improve the following MMTs to >/= 4/5 to show improvement in strength:  hip abd and ext, 10x SL heel raise bil   Baseline: see chart in note 08/07/2021 INITIAL  3 Lance [name] will report >/= 50% decrease in pain from evaluation    Baseline: 9/10 max pain 08/07/2021 INITIAL  4 Lance [name] will be able to return to work, not limited by pain   Baseline: limited by pain 08/07/2021 INITIAL  5 Lance [name] will be able to stand for 4 hrs while at work, not limited by pain    Baseline: 2 hours 08/07/2021 INITIAL  6 Lance [name] will be able to stand for >30'' in SLS stance, to show a significant improvement in balance in order to reduce fall risk    Baseline: 10'' with high level of instability 08/07/2021 INITIAL    PLAN: PT FREQUENCY: 1-2x/week   PT DURATION: 8 weeks (Ending 08/07/2021)   PLANNED INTERVENTIONS: Therapeutic exercises, Therapeutic activity, Neuro Muscular re-education, Gait training, Patient/Family education, Joint mobilization, Dry Needling, Electrical stimulation, Spinal mobilization and/or manipulation, Moist heat, Taping, Vasopneumatic device, Ionotophoresis 15m/ml Dexamethasone, and Manual therapy   PLAN FOR NEXT SESSION: IASTM, PF loading, foot intrinsic strengthening, general LE strengthening, orthotics   Ruba Outen E Kaylyn Garrow PT 06/17/2021, 9:59 AM

## 2021-06-20 ENCOUNTER — Ambulatory Visit: Payer: 59

## 2021-06-24 ENCOUNTER — Encounter: Payer: PRIVATE HEALTH INSURANCE | Admitting: Physical Therapy

## 2021-06-26 ENCOUNTER — Encounter: Payer: Self-pay | Admitting: Physical Therapy

## 2021-06-26 ENCOUNTER — Ambulatory Visit: Payer: 59 | Admitting: Physical Therapy

## 2021-06-26 ENCOUNTER — Other Ambulatory Visit: Payer: Self-pay

## 2021-06-26 DIAGNOSIS — M79671 Pain in right foot: Secondary | ICD-10-CM

## 2021-06-26 DIAGNOSIS — M79672 Pain in left foot: Secondary | ICD-10-CM | POA: Diagnosis not present

## 2021-06-26 DIAGNOSIS — M6281 Muscle weakness (generalized): Secondary | ICD-10-CM

## 2021-06-26 DIAGNOSIS — R2681 Unsteadiness on feet: Secondary | ICD-10-CM

## 2021-06-26 NOTE — Therapy (Signed)
OUTPATIENT PHYSICAL THERAPY TREATMENT NOTE   Patient Name: Lance [name] MRN: [id number] DOB:1960-05-28, 61 y.o., male Today's Date: 06/26/2021  PCP: Trey Sailors, [place] REFERRING PROVIDER: Criselda Peaches, DPM   PT End of Session - 06/26/21 1214     Visit Number 3    Number of Visits 16    Date for PT Re-Evaluation 08/07/21    Authorization Type [employer]    PT Start Time 1215    PT Stop Time 1258    PT Time Calculation (min) 43 min             History reviewed. No pertinent past medical history. Past Surgical History:  Procedure Laterality Date   APPENDECTOMY     Patient Active Problem List   Diagnosis Date Noted   Left foot pain 08/31/2017   Acute pain of left knee 08/31/2017   Left hip pain 08/31/2017   Left hand pain 08/31/2017   Acute pain of left shoulder 08/31/2017   Plantar fasciitis of left foot 08/31/2017    REFERRING DIAG: Referral diagnosis: Plantar fasciitis [M72.2]  THERAPY DIAG:  Pain in left foot  Pain in right foot  Muscle weakness  Unsteadiness on feet  PERTINENT HISTORY: none  PRECAUTIONS/RESTRICTIONS:   none  SUBJECTIVE:  Pt reports that he got new tennis shoes which are more comfortable.  He has some improvement.  He reports that he lost some teeth.  Pain:  Are you having pain? Yes Pain location: L heel NPRS scale:  current 4/10  Aggravating factors: standing (2 hours to get to high level) Relieving factors: sitting, ice, rest Pain description: sharp Severity: high Irritability: moderate Stage: Chronic Stability: staying the same 24 hour pattern: worst first thing in the morning then gets better, then worse throughout day.   OBJECTIVE:  LE MMT:   MMT Right 06/12/2021 Left 06/12/2021  Hip flexion (L2, L3)      Knee extension (L3)      Knee flexion    Hip abduction 3+ 3  Hip extension  3+  3+  Hip external rotation      Hip internal rotation      Hip adduction      Ankle dorsiflexion (L4)       Ankle plantarflexion (S1) 10x SLHR 6x SLHR  Ankle inversion      Ankle eversion      Great Toe ext (L5)        (Blank rows = not tested, score listed is out of 5 possible points.  N = WNL, D = diminished, C = clear for gross weakness with myotome testing, * = concordant pain with testing)   LE ROM:   ROM Right 06/12/2021 Left 06/12/2021  Hip flexion      Hip extension      Hip abduction      Hip adduction      Hip internal rotation      Hip external rotation      Knee flexion      Knee extension      Ankle dorsiflexion 40 cc 40 cc  Ankle plantarflexion      Ankle inversion      Ankle eversion        (Blank rows = not tested, N = WNL, * = concordant pain with testing)   ASTERISK SIGNS   Asterisk Signs        SLHR        Hip abd and ext  TREATMENT:  Therapeutic Exercise: - bike - 5 min - L3 - while taking subjective - Slant board stretch 45''x3 - heel raise on 2'' step with tennis ball to prevent supination - 4x10 - S/L clam with ankle raised - 3x10 - Blue TB - Leg press - 3x10 - 55# - S/L hip abduction (next visit)  Update HEP next session  Manual Therapy: - STM and IASTM plantar surface L foot  Neuromuscular re-ed: - tandem on foam 45'' bouts - no shoes - PF/DF on wooden rocker board - 20x2   HOME EXERCISE PROGRAM: Access Code: [email or web address] URL: [email or web address] Date: 06/17/2021 Prepared by: Shearon Balo  Exercises Seated Plantar Fascia Stretch - 3 x daily - 7 x weekly - 3 sets - 10 reps Seated Plantar Fascia Mobilization with Small Ball - 3 x daily - 7 x weekly - 3 sets - 10 reps Seated Toe Towel Scrunches - 3 x daily - 7 x weekly - 3 sets - 10 reps Clam with Resistance - 1 x daily - 7 x weekly - 3 sets - 10 reps      ASSESSMENT:   CLINICAL IMPRESSION: Lance [name] is progressing well with therapy.  Pt reports no increase in baseline pain following therapy.  Today we concentrated on hip  strengthening, ankle strengthening, and balance/proprioception.  Pt with increased tolerance to manual therapy and exercise today, showing progression.  His new shoes are definitely helpful.  Pt will continue to benefit from skilled physical therapy to address remaining deficits and achieve listed goals.  Continue per POC.      GOALS:   SHORT TERM GOALS:   STG Name Target Date Goal status  1 Lance [name] will be >75% HEP compliant to improve carryover between sessions and facilitate independent management of condition   Baseline: No HEP 07/03/2021 MET 2/7    LONG TERM GOALS:    LTG Name Target Date Goal status  1 Lance [name] will improve FOTO score from 27 (baseline) to 53 as a proxy for functional improvement 08/07/2021 INITIAL  2 Lance [name] will improve the following MMTs to >/= 4/5 to show improvement in strength:  hip abd and ext, 10x SL heel raise bil   Baseline: see chart in note 08/07/2021 INITIAL  3 Lance [name] will report >/= 50% decrease in pain from evaluation    Baseline: 9/10 max pain 08/07/2021 INITIAL  4 Lance [name] will be able to return to work, not limited by pain   Baseline: limited by pain 08/07/2021 INITIAL  5 Lance [name] will be able to stand for 4 hrs while at work, not limited by pain    Baseline: 2 hours 08/07/2021 INITIAL  6 Lance [name] will be able to stand for >30'' in SLS stance, to show a significant improvement in balance in order to reduce fall risk    Baseline: 10'' with high level of instability 08/07/2021 INITIAL    PLAN: PT FREQUENCY: 1-2x/week   PT DURATION: 8 weeks (Ending 08/07/2021)   PLANNED INTERVENTIONS: Therapeutic exercises, Therapeutic activity, Neuro Muscular re-education, Gait training, Patient/Family education, Joint mobilization, Dry Needling, Electrical stimulation, Spinal mobilization and/or manipulation, Moist heat, Taping, Vasopneumatic device, Ionotophoresis 36m/ml Dexamethasone, and Manual therapy   PLAN FOR NEXT SESSION: IASTM, PF  loading, foot intrinsic strengthening, general LE strengthening, orthotics   KKevan NyReinhartsen PT 06/26/2021, 12:56 PM

## 2021-07-02 ENCOUNTER — Other Ambulatory Visit: Payer: Self-pay

## 2021-07-02 ENCOUNTER — Ambulatory Visit: Payer: 59 | Admitting: Physical Therapy

## 2021-07-04 ENCOUNTER — Other Ambulatory Visit: Payer: Self-pay

## 2021-07-04 ENCOUNTER — Encounter: Payer: Self-pay | Admitting: Physical Therapy

## 2021-07-04 ENCOUNTER — Ambulatory Visit: Payer: 59 | Admitting: Physical Therapy

## 2021-07-04 DIAGNOSIS — M79672 Pain in left foot: Secondary | ICD-10-CM | POA: Diagnosis not present

## 2021-07-04 DIAGNOSIS — M6281 Muscle weakness (generalized): Secondary | ICD-10-CM

## 2021-07-04 DIAGNOSIS — M79671 Pain in right foot: Secondary | ICD-10-CM

## 2021-07-04 NOTE — Therapy (Signed)
OUTPATIENT PHYSICAL THERAPY TREATMENT NOTE   Patient Name: Lance [name] MRN: [id number] DOB:05/22/1960, 61 y.o., male Today's Date: 07/04/2021  PCP: Trey Sailors, [place] REFERRING PROVIDER: Criselda Peaches, DPM   PT End of Session - 07/04/21 1214     Visit Number 4    Number of Visits 16    Date for PT Re-Evaluation 08/07/21    Authorization Type [employer]    PT Start Time 1215    PT Stop Time 1258    PT Time Calculation (min) 43 min             History reviewed. No pertinent past medical history. Past Surgical History:  Procedure Laterality Date   APPENDECTOMY     Patient Active Problem List   Diagnosis Date Noted   Left foot pain 08/31/2017   Acute pain of left knee 08/31/2017   Left hip pain 08/31/2017   Left hand pain 08/31/2017   Acute pain of left shoulder 08/31/2017   Plantar fasciitis of left foot 08/31/2017    REFERRING DIAG: Referral diagnosis: Plantar fasciitis [M72.2]  THERAPY DIAG:  Pain in left foot  Pain in right foot  Muscle weakness  PERTINENT HISTORY: none  PRECAUTIONS/RESTRICTIONS:   none  SUBJECTIVE:  Pt reports that his heel still hurts when he stands for long periods.  He thinks maybe he was too aggressive with rolling the bottom of his foot with a ball.    Pain:  Are you having pain? Yes Pain location: L heel NPRS scale:  current 5/10  Aggravating factors: standing (2 hours to get to high level) Relieving factors: sitting, ice, rest Pain description: sharp Severity: high Irritability: moderate Stage: Chronic 24 hour pattern: worst first thing in the morning then gets better, then worse throughout day.   OBJECTIVE:  LE MMT:   MMT Right 06/12/2021 Left 06/12/2021  Hip flexion (L2, L3)      Knee extension (L3)      Knee flexion    Hip abduction 3+ 3  Hip extension  3+  3+  Hip external rotation      Hip internal rotation      Hip adduction      Ankle dorsiflexion (L4)      Ankle plantarflexion  (S1) 10x SLHR 6x SLHR  Ankle inversion      Ankle eversion      Great Toe ext (L5)        (Blank rows = not tested, score listed is out of 5 possible points.  N = WNL, D = diminished, C = clear for gross weakness with myotome testing, * = concordant pain with testing)   LE ROM:   ROM Right 06/12/2021 Left 06/12/2021  Hip flexion      Hip extension      Hip abduction      Hip adduction      Hip internal rotation      Hip external rotation      Knee flexion      Knee extension      Ankle dorsiflexion 40 cc 40 cc  Ankle plantarflexion      Ankle inversion      Ankle eversion        (Blank rows = not tested, N = WNL, * = concordant pain with testing)   ASTERISK SIGNS   Asterisk Signs 2/24       SLHR        Hip abd and ext L hip abd 4/5  TREATMENT:  Therapeutic Exercise: - bike - 5 min - L4 - while taking subjective - Slant board stretch 45''x3 - heel raise on 2'' step with tennis ball to prevent supination - 2x20 - heel raise on 2'' step with tennis ball to prevent supination toes in ext - 2x20 - S/L clam - 3x10 - Black TB - Leg press - 3x10 - 60# - S/L hip abduction 3x10 (L)  Manual Therapy: - STM and IASTM plantar surface L foot  Neuromuscular re-ed: - tandem on foam 45'' bouts - no shoes - SLS (stopped d/t pain)   HOME EXERCISE PROGRAM: Access Code: [email or web address] URL: [email or web address] Date: 06/17/2021 Prepared by: Shearon Balo  Exercises Seated Plantar Fascia Stretch - 3 x daily - 7 x weekly - 3 sets - 10 reps Seated Plantar Fascia Mobilization with Small Ball - 3 x daily - 7 x weekly - 3 sets - 10 reps Seated Toe Towel Scrunches - 3 x daily - 7 x weekly - 3 sets - 10 reps Clam with Resistance - 1 x daily - 7 x weekly - 3 sets - 10 reps      ASSESSMENT:   CLINICAL IMPRESSION: Lance [name] is progressing fair with therapy.  Pt reports a mild increase in pain following therapy.  Today we concentrated on   plantar fascia mobility .  Pt subjectively reports little change in standing tolerance, but in clinic is consistently able to progress intensity of exercise.  MT did reduce pain after therex; we will make more time for manual next session.  Pt will continue to benefit from skilled physical therapy to address remaining deficits and achieve listed goals.  Continue per POC.      GOALS:   SHORT TERM GOALS:   STG Name Target Date Goal status  1 Lance [name] will be >75% HEP compliant to improve carryover between sessions and facilitate independent management of condition   Baseline: No HEP 07/03/2021 MET 2/7    LONG TERM GOALS:    LTG Name Target Date Goal status  1 Lance [name] will improve FOTO score from 27 (baseline) to 53 as a proxy for functional improvement 08/07/2021 INITIAL  2 Lance [name] will improve the following MMTs to >/= 4/5 to show improvement in strength:  hip abd and ext, 10x SL heel raise bil   Baseline: see chart in note 08/07/2021 INITIAL  3 Lance [name] will report >/= 50% decrease in pain from evaluation    Baseline: 9/10 max pain 08/07/2021 INITIAL  4 Lance [name] will be able to return to work, not limited by pain   Baseline: limited by pain 08/07/2021 INITIAL  5 Lance [name] will be able to stand for 4 hrs while at work, not limited by pain    Baseline: 2 hours 08/07/2021 INITIAL  6 Lance [name] will be able to stand for >30'' in SLS stance, to show a significant improvement in balance in order to reduce fall risk    Baseline: 10'' with high level of instability 08/07/2021 INITIAL    PLAN: PT FREQUENCY: 1-2x/week   PT DURATION: 8 weeks (Ending 08/07/2021)   PLANNED INTERVENTIONS: Therapeutic exercises, Therapeutic activity, Neuro Muscular re-education, Gait training, Patient/Family education, Joint mobilization, Dry Needling, Electrical stimulation, Spinal mobilization and/or manipulation, Moist heat, Taping, Vasopneumatic device, Ionotophoresis 30m/ml Dexamethasone, and Manual  therapy   PLAN FOR NEXT SESSION: IASTM, PF loading, foot intrinsic strengthening, general LE strengthening, orthotics   Whitaker Holderman E Burnell Matlin PT 07/04/2021, 1:00 PM

## 2021-07-08 ENCOUNTER — Other Ambulatory Visit: Payer: Self-pay

## 2021-07-08 ENCOUNTER — Encounter: Payer: Self-pay | Admitting: Physical Therapy

## 2021-07-08 ENCOUNTER — Ambulatory Visit: Payer: 59 | Admitting: Physical Therapy

## 2021-07-08 DIAGNOSIS — M79672 Pain in left foot: Secondary | ICD-10-CM

## 2021-07-08 DIAGNOSIS — M79671 Pain in right foot: Secondary | ICD-10-CM

## 2021-07-08 DIAGNOSIS — M6281 Muscle weakness (generalized): Secondary | ICD-10-CM

## 2021-07-08 DIAGNOSIS — R2681 Unsteadiness on feet: Secondary | ICD-10-CM

## 2021-07-08 NOTE — Therapy (Signed)
OUTPATIENT PHYSICAL THERAPY TREATMENT NOTE   Patient Name: Lance [name] MRN: [id number] DOB:Sep 25, 1960, 61 y.o., male Today's Date: 07/08/2021  PCP: Trey Sailors, [place] REFERRING PROVIDER: Trey Sailors, [place]   PT End of Session - 07/08/21 1212     Visit Number 5    Number of Visits 16    Date for PT Re-Evaluation 08/07/21    Authorization Type [employer]    PT Start Time 1215    PT Stop Time 1258    PT Time Calculation (min) 43 min             History reviewed. No pertinent past medical history. Past Surgical History:  Procedure Laterality Date   APPENDECTOMY     Patient Active Problem List   Diagnosis Date Noted   Left foot pain 08/31/2017   Acute pain of left knee 08/31/2017   Left hip pain 08/31/2017   Left hand pain 08/31/2017   Acute pain of left shoulder 08/31/2017   Plantar fasciitis of left foot 08/31/2017    REFERRING DIAG: Referral diagnosis: Plantar fasciitis [M72.2]  THERAPY DIAG:  Pain in left foot  Pain in right foot  Muscle weakness  Unsteadiness on feet  PERTINENT HISTORY: none  PRECAUTIONS/RESTRICTIONS:   none  SUBJECTIVE:  Pt reports his pain is roughly the same.  He has been HEP compliant   Pain:  Are you having pain? Yes Pain location: L heel NPRS scale:  current 5/10  Aggravating factors: standing (2 hours to get to high level) Relieving factors: sitting, ice, rest Pain description: sharp Severity: high Irritability: moderate Stage: Chronic 24 hour pattern: worst first thing in the morning then gets better, then worse throughout day.   OBJECTIVE:  LE MMT:   MMT Right 06/12/2021 Left 06/12/2021  Hip flexion (L2, L3)      Knee extension (L3)      Knee flexion    Hip abduction 3+ 3  Hip extension  3+  3+  Hip external rotation      Hip internal rotation      Hip adduction      Ankle dorsiflexion (L4)      Ankle plantarflexion (S1) 10x SLHR 6x SLHR  Ankle inversion      Ankle eversion       Great Toe ext (L5)        (Blank rows = not tested, score listed is out of 5 possible points.  N = WNL, D = diminished, C = clear for gross weakness with myotome testing, * = concordant pain with testing)   LE ROM:   ROM Right 06/12/2021 Left 06/12/2021  Hip flexion      Hip extension      Hip abduction      Hip adduction      Hip internal rotation      Hip external rotation      Knee flexion      Knee extension      Ankle dorsiflexion 40 cc 40 cc  Ankle plantarflexion      Ankle inversion      Ankle eversion        (Blank rows = not tested, N = WNL, * = concordant pain with testing)   ASTERISK SIGNS   Asterisk Signs 2/24 2/28      SLHR  L unable with full ROM      Hip abd and ext L hip abd 4/5  TREATMENT:  Therapeutic Exercise: - bike - 5 min - L4 - while taking subjective - Slant board stretch 45''x3 - Heel raise on step with towel for toe ext + tennis ball - 3x15 - Leg press - 3x10 - 45# - L only   Manual Therapy: - STM and IASTM plantar surface L foot   HOME EXERCISE PROGRAM: Access Code: [email or web address] URL: [email or web address] Date: 06/17/2021 Prepared by: Shearon Balo  Exercises Seated Plantar Fascia Stretch - 3 x daily - 7 x weekly - 3 sets - 10 reps Seated Plantar Fascia Mobilization with Small Ball - 3 x daily - 7 x weekly - 3 sets - 10 reps Seated Toe Towel Scrunches - 3 x daily - 7 x weekly - 3 sets - 10 reps Clam with Resistance - 1 x daily - 7 x weekly - 3 sets - 10 reps      ASSESSMENT:   CLINICAL IMPRESSION: Lance [name] is progressing fair with therapy.  Pt reports mild pain reduction following therapy.  Today we concentrated on pain reduction and PF mobility .  Pt with continued complaints of heel pain today.  He is tolerating higher intensity exercises and increased pressure during manual therapy.  We will continue for ~2 more weeks and if no significant functional improvement is made I plan  to refer back for possible injection.  Pt will continue to benefit from skilled physical therapy to address remaining deficits and achieve listed goals.  Continue per POC.      GOALS:   SHORT TERM GOALS:   STG Name Target Date Goal status  1 Lance [name] will be >75% HEP compliant to improve carryover between sessions and facilitate independent management of condition   Baseline: No HEP 07/03/2021 MET 2/7    LONG TERM GOALS:    LTG Name Target Date Goal status  1 Lance [name] will improve FOTO score from 27 (baseline) to 53 as a proxy for functional improvement 08/07/2021 INITIAL  2 Lance [name] will improve the following MMTs to >/= 4/5 to show improvement in strength:  hip abd and ext, 10x SL heel raise bil   Baseline: see chart in note 08/07/2021 INITIAL  3 Lance [name] will report >/= 50% decrease in pain from evaluation    Baseline: 9/10 max pain 08/07/2021 INITIAL  4 Lance [name] will be able to return to work, not limited by pain   Baseline: limited by pain 08/07/2021 INITIAL  5 Lance [name] will be able to stand for 4 hrs while at work, not limited by pain    Baseline: 2 hours 08/07/2021 INITIAL  6 Lance [name] will be able to stand for >30'' in SLS stance, to show a significant improvement in balance in order to reduce fall risk    Baseline: 10'' with high level of instability 08/07/2021 INITIAL    PLAN: PT FREQUENCY: 1-2x/week   PT DURATION: 8 weeks (Ending 08/07/2021)   PLANNED INTERVENTIONS: Therapeutic exercises, Therapeutic activity, Neuro Muscular re-education, Gait training, Patient/Family education, Joint mobilization, Dry Needling, Electrical stimulation, Spinal mobilization and/or manipulation, Moist heat, Taping, Vasopneumatic device, Ionotophoresis 28m/ml Dexamethasone, and Manual therapy   PLAN FOR NEXT SESSION: IASTM, PF loading, foot intrinsic strengthening, general LE strengthening, orthotics   KKevan [name] PT 07/08/2021, 1:04 PM

## 2021-07-10 ENCOUNTER — Ambulatory Visit: Payer: 59 | Admitting: Physical Therapy

## 2021-07-15 ENCOUNTER — Ambulatory Visit: Payer: 59 | Attending: Podiatry | Admitting: Physical Therapy

## 2021-07-15 ENCOUNTER — Telehealth: Payer: Self-pay | Admitting: Physical Therapy

## 2021-07-15 DIAGNOSIS — M6281 Muscle weakness (generalized): Secondary | ICD-10-CM | POA: Insufficient documentation

## 2021-07-15 DIAGNOSIS — M79672 Pain in left foot: Secondary | ICD-10-CM | POA: Insufficient documentation

## 2021-07-15 DIAGNOSIS — M79671 Pain in right foot: Secondary | ICD-10-CM | POA: Insufficient documentation

## 2021-07-15 DIAGNOSIS — R2681 Unsteadiness on feet: Secondary | ICD-10-CM | POA: Insufficient documentation

## 2021-07-15 NOTE — Telephone Encounter (Signed)
VM full.  Pt had called earlier in the day to request call from PT. ?

## 2021-07-17 ENCOUNTER — Encounter: Payer: Self-pay | Admitting: Physical Therapy

## 2021-07-17 ENCOUNTER — Ambulatory Visit: Payer: 59 | Admitting: Physical Therapy

## 2021-07-17 ENCOUNTER — Other Ambulatory Visit: Payer: Self-pay

## 2021-07-17 DIAGNOSIS — M6281 Muscle weakness (generalized): Secondary | ICD-10-CM

## 2021-07-17 DIAGNOSIS — M79671 Pain in right foot: Secondary | ICD-10-CM

## 2021-07-17 DIAGNOSIS — M79672 Pain in left foot: Secondary | ICD-10-CM | POA: Diagnosis not present

## 2021-07-17 DIAGNOSIS — R2681 Unsteadiness on feet: Secondary | ICD-10-CM

## 2021-07-17 NOTE — Therapy (Signed)
OUTPATIENT PHYSICAL THERAPY TREATMENT NOTE   Patient Name: Donterius Filley MRN: [id number] DOB:1960/11/11, 61 y.o., male Today's Date: 07/17/2021  PCP: Trey Sailors, [place] REFERRING PROVIDER: Criselda Peaches, DPM   PT End of Session - 07/17/21 1616     Visit Number 6    Number of Visits 16    Date for PT Re-Evaluation 08/07/21    Authorization Type [employer]    PT Start Time 7591    PT Stop Time 1658    PT Time Calculation (min) 43 min             History reviewed. No pertinent past medical history. Past Surgical History:  Procedure Laterality Date   APPENDECTOMY     Patient Active Problem List   Diagnosis Date Noted   Left foot pain 08/31/2017   Acute pain of left knee 08/31/2017   Left hip pain 08/31/2017   Left hand pain 08/31/2017   Acute pain of left shoulder 08/31/2017   Plantar fasciitis of left foot 08/31/2017    REFERRING DIAG: Referral diagnosis: Plantar fasciitis [M72.2]  THERAPY DIAG:  Pain in left foot  Pain in right foot  Muscle weakness  Unsteadiness on feet  PERTINENT HISTORY: none  PRECAUTIONS/RESTRICTIONS:   none  SUBJECTIVE:  Pt reports that he has had little pain relief from PT.  He feels the "foot massage" was not helpful  Pain:  Are you having pain? Yes Pain location: L heel NPRS scale:  current 7/10  Aggravating factors: standing (2 hours to get to high level) Relieving factors: sitting, ice, rest Pain description: sharp Severity: high Irritability: moderate Stage: Chronic 24 hour pattern: worst first thing in the morning then gets better, then worse throughout day.   OBJECTIVE:  LE MMT:   MMT Right 06/12/2021 Left 06/12/2021 07/17/21 R 07/17/21 L  Hip flexion (L2, L3)        Knee extension (L3)        Knee flexion      Hip abduction 3+ 3 4 4   Hip extension  3+  3+    Hip external rotation        Hip internal rotation        Hip adduction        Ankle dorsiflexion (L4)        Ankle  plantarflexion (S1) 10x SLHR 6x SLHR    Ankle inversion        Ankle eversion        Great Toe ext (L5)          (Blank rows = not tested, score listed is out of 5 possible points.  N = WNL, D = diminished, C = clear for gross weakness with myotome testing, * = concordant pain with testing)   LE ROM:   ROM Right 06/12/2021 Left 06/12/2021  Hip flexion      Hip extension      Hip abduction      Hip adduction      Hip internal rotation      Hip external rotation      Knee flexion      Knee extension      Ankle dorsiflexion 40 cc 40 cc  Ankle plantarflexion      Ankle inversion      Ankle eversion        (Blank rows = not tested, N = WNL, * = concordant pain with testing)   ASTERISK SIGNS   Asterisk Signs 2/24 2/28  SLHR  L unable with full ROM      Hip abd and ext L hip abd 4/5                                 TREATMENT:  Therapeutic Exercise: - bike - 5 min - L4 - while taking subjective - Slant board stretch 45''x3 - long sitting PF stretch  Therapeutic Activity - collecting information for goals, checking progress, and reviewing with patient   HOME EXERCISE PROGRAM: Access Code: [email or web address] URL: [email or web address] Date: 06/17/2021 Prepared by: Shearon Balo  Exercises Seated Plantar Fascia Stretch - 3 x daily - 7 x weekly - 3 sets - 10 reps Seated Plantar Fascia Mobilization with Small Ball - 3 x daily - 7 x weekly - 3 sets - 10 reps Seated Toe Towel Scrunches - 3 x daily - 7 x weekly - 3 sets - 10 reps Clam with Resistance - 1 x daily - 7 x weekly - 3 sets - 10 reps      ASSESSMENT:   CLINICAL IMPRESSION: Etheridge is progressing poorly with therapy.  Pt reports no increase in baseline pain following therapy.  Today we concentrated on  calf stretching, balance, and PF stretching .  Pt has made minimal progress with PT, I recommended he call his MD for follow up; he agrees with plan.  Pt will continue to benefit from skilled  physical therapy to address remaining deficits and achieve listed goals.  Continue per POC.      GOALS:   SHORT TERM GOALS:   STG Name Target Date Goal status  1 Jaymarion will be >75% HEP compliant to improve carryover between sessions and facilitate independent management of condition   Baseline: No HEP 07/03/2021 MET 2/7    LONG TERM GOALS:    LTG Name Target Date Goal status  1 Olsen will improve FOTO score from 27 (baseline) to 53 as a proxy for functional improvement 08/07/2021 INITIAL  2 Deadrick will improve the following MMTs to >/= 4/5 to show improvement in strength:  hip abd and ext, 10x SL heel raise bil   Baseline: see chart in note 08/07/2021 Partially met  3 Harless will report >/= 50% decrease in pain from evaluation    Baseline: 9/10 max pain  3/9: 910 max 08/07/2021 NOT MET  4 Marlow will be able to return to work, not limited by pain   Baseline: limited by pain  3/9: unable 08/07/2021 NOT MET  5 Ovie will be able to stand for 4 hrs while at work, not limited by pain    Baseline: 2 hours  3/9: <2 hours 08/07/2021 NOT MET  6 Rolla will be able to stand for >30'' in SLS stance, to show a significant improvement in balance in order to reduce fall risk    Baseline: 10'' with high level of instability  3/9:  <10'' 08/07/2021 NOT MET    PLAN: PT FREQUENCY: 1-2x/week   PT DURATION: 8 weeks (Ending 08/07/2021)   PLANNED INTERVENTIONS: Therapeutic exercises, Therapeutic activity, Neuro Muscular re-education, Gait training, Patient/Family education, Joint mobilization, Dry Needling, Electrical stimulation, Spinal mobilization and/or manipulation, Moist heat, Taping, Vasopneumatic device, Ionotophoresis 41m/ml Dexamethasone, and Manual therapy   PLAN FOR NEXT SESSION: IASTM, PF loading, foot intrinsic strengthening, general LE strengthening, orthotics   KKevan NyReinhartsen PT 07/17/2021, 4:17 PM

## 2021-07-22 ENCOUNTER — Encounter: Payer: Self-pay | Admitting: Physical Therapy

## 2021-07-24 ENCOUNTER — Ambulatory Visit: Payer: 59 | Admitting: Physical Therapy

## 2021-09-09 ENCOUNTER — Other Ambulatory Visit: Payer: Self-pay | Admitting: Podiatry

## 2021-09-09 ENCOUNTER — Ambulatory Visit: Payer: Managed Care, Other (non HMO) | Admitting: Podiatry

## 2021-09-15 ENCOUNTER — Ambulatory Visit (INDEPENDENT_AMBULATORY_CARE_PROVIDER_SITE_OTHER): Payer: 59 | Admitting: Podiatry

## 2021-09-15 ENCOUNTER — Ambulatory Visit (INDEPENDENT_AMBULATORY_CARE_PROVIDER_SITE_OTHER): Payer: 59

## 2021-09-15 DIAGNOSIS — M722 Plantar fascial fibromatosis: Secondary | ICD-10-CM | POA: Diagnosis not present

## 2021-09-15 DIAGNOSIS — M21862 Other specified acquired deformities of left lower leg: Secondary | ICD-10-CM

## 2021-09-15 MED ORDER — MELOXICAM 15 MG PO TABS
15.0000 mg | ORAL_TABLET | Freq: Every day | ORAL | 3 refills | Status: DC
Start: 2021-09-15 — End: 2022-03-30

## 2021-09-21 ENCOUNTER — Encounter: Payer: Self-pay | Admitting: Podiatry

## 2021-09-21 NOTE — Progress Notes (Signed)
?  Subjective:  ?Patient ID: Lance [name], male    DOB: 1961-01-22,  MRN: [id number] ? ?Chief Complaint  ?Patient presents with  ? Foot Pain  ?  Left foot pain. Pain begins on left arch and radiates to heel. Pain is a sharp pain when standing. Patient is unable to stand more than 2 hours.  ? ? ?61 y.o. male presents with the above complaint. History confirmed with patient.  It still very painful for him.  He did do some physical therapy but it has not helped.  The right foot is doing better but the left is still very painful ? ?Objective:  ?Physical Exam: ?warm, good capillary refill, no trophic changes or ulcerative lesions, normal DP and PT pulses, and normal sensory exam. ?Left Foot: point tenderness of the mid plantar fascia as well as the Achilles ?Right Foot: Little to no pain ? ?Left foot x-rays taken today show minimal degenerative changes no fracture and a small heel spur ?Assessment:  ? ?1. Plantar fasciitis   ?2. Gastrocnemius equinus of left lower extremity   ? ? ? ?Plan:  ?Patient was evaluated and treated and all questions answered. ? 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  He has done physical therapy injections and anti-inflammatories and this has not helped.  I recommended an MRI to evaluate the plantar fascia.  We discussed he may require surgical intervention.  Surgical we discussed EPF and gastrocnemius recession.  This was ordered today.  An injection was also performed today for relief with 10 mg of Kenalog and 2 mg of dexamethasone as well as 1 cc lidocaine is was tolerated well.  I will see him back after the MRI for further evaluation and planning.  Continue home stretching plan.  Meloxicam Rx was sent to pharmacy. ? ? ?Return in about 1 month (around 10/16/2021) for after MRI to review.  ? ?

## 2021-09-23 ENCOUNTER — Other Ambulatory Visit (HOSPITAL_COMMUNITY): Payer: Self-pay | Admitting: Family Medicine

## 2021-10-01 ENCOUNTER — Telehealth: Payer: Self-pay | Admitting: Podiatry

## 2021-10-01 NOTE — Telephone Encounter (Signed)
Lance [name] from [facility] called about pts mri and they are needing authorization before they can schedule pt. He has [employer]. The pt called them about getting scheduled. The orders were placed for the mri 5.14.2023.. If you need to contact radiology in [place] the number is [phone]

## 2021-10-02 NOTE — Telephone Encounter (Signed)
Prior approval has been authorized (# [id number] from 10/01/21-01/01/22)from the [employer]. [facility] has been notified for scheduling.

## 2021-10-04 ENCOUNTER — Ambulatory Visit (INDEPENDENT_AMBULATORY_CARE_PROVIDER_SITE_OTHER): Payer: 59

## 2021-10-04 DIAGNOSIS — M722 Plantar fascial fibromatosis: Secondary | ICD-10-CM

## 2021-11-12 ENCOUNTER — Ambulatory Visit: Payer: 59 | Admitting: Podiatry

## 2021-12-02 ENCOUNTER — Ambulatory Visit (INDEPENDENT_AMBULATORY_CARE_PROVIDER_SITE_OTHER): Payer: 59 | Admitting: Podiatry

## 2021-12-02 DIAGNOSIS — M722 Plantar fascial fibromatosis: Secondary | ICD-10-CM

## 2021-12-02 DIAGNOSIS — M7662 Achilles tendinitis, left leg: Secondary | ICD-10-CM

## 2021-12-02 DIAGNOSIS — M21862 Other specified acquired deformities of left lower leg: Secondary | ICD-10-CM

## 2021-12-02 MED ORDER — METHYLPREDNISOLONE 4 MG PO TBPK: ORAL_TABLET | ORAL | 0 refills | Status: DC

## 2021-12-03 ENCOUNTER — Telehealth: Payer: Self-pay | Admitting: Podiatry

## 2021-12-03 NOTE — Progress Notes (Signed)
Subjective:  Patient ID: Lance [name], male    DOB: 03-22-1961,  MRN: [id number]  Chief Complaint  Patient presents with   Plantar Fasciitis    Follow up after MRI left foot    61 y.o. male presents with the above complaint. History confirmed with patient.  Still very painful.  He completed the MRI  Objective:  Physical Exam: warm, good capillary refill, no trophic changes or ulcerative lesions, normal DP and PT pulses, and normal sensory exam. Left Foot: point tenderness of the mid plantar fascia as well as the Achilles insertion Right Foot: Little to no pain  Left foot x-rays taken today show minimal degenerative changes no fracture and a small heel spur  Study Result  Narrative & Impression  CLINICAL DATA:  Plantar fasciitis.   EXAM: MR OF THE LEFT HEEL WITHOUT CONTRAST   TECHNIQUE: Multiplanar, multisequence MR imaging of the left ankle was performed. No intravenous contrast was administered.   COMPARISON:  Left foot radiographs 09/15/2021   FINDINGS: TENDONS   Peroneal: The peroneus longus and brevis tendons are intact.   Posteromedial: Mild intermediate T2 signal posterior tibial insertional tendinosis. Mild posterior tibial tenosynovitis. The flexor digitorum longus and flexor hallucis longus tendons are intact.   Anterior: The tibialis anterior, extensor hallucis longus, and extensor digitorum longus tendons are intact.   Achilles: Minimal intermediate T2 signal mid and distal Achilles tendinosis, predominantly within the anteromedial aspect. No fluid bright tear.   Plantar Fascia: Large medial plantar calcaneal heel spur. No calcaneal marrow edema. Mild chronic thickening of the medial band of the plantar fascia but no increased fluid sensitive signal at this time to indicate active plantar fasciitis.   LIGAMENTS   Lateral: Mild attenuation of the anterior talofibular ligament, likely a remote partial-thickness tear. Probable chronic  attenuation of the anterior tibiofibular ligament, remote partial-thickness tear. The posterior talofibular and tibiofibular ligaments are intact.   Medial: The tibiotalar deep deltoid and tibial spring ligaments are intact.   CARTILAGE   Ankle Joint: Mild tibiotalar cartilage thinning.   Subtalar Joints/Sinus Tarsi: Fat is preserved within sinus tarsi. Mild posterior subtalar cartilage thinning and Taylor sided subchondral marrow edema.   Bones: Mild-to-moderate talonavicular and navicular-cuneiform cartilage thinning. Moderate first tarsometatarsal cartilage thinning with mild-to-moderate base of the great toe metatarsal subchondral degenerative cyst. Moderate cartilage thinning throughout the rest of the tarsometatarsal joints.   Other: None.   IMPRESSION: 1. Large plantar calcaneal heel spur. Mild chronic thickening of the medial band of the plantar fascia but no increased fluid sensitive signal at this time to indicate active plantar fasciitis. 2. 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 3. Minimal mid and distal Achilles tendinosis. 4. Mild posterior tibial insertional tendinosis with mild tenosynovitis. 5. 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     Electronically Signed   By: Neita Garnet M.D.   On: 10/06/2021 22:25   Assessment:   1. Plantar fasciitis   2. Gastrocnemius equinus of left lower extremity   3. Achilles tendinitis, left leg      Plan:  Patient was evaluated and treated and all questions answered.  We reviewed the results of his MRI as well as the images in detail together.  We discussed further treatment including nonsurgical and surgical treatment.  At this point he has exhausted all nonsurgical treatment options that are reasonable.  I discussed with him surgical treatment with plantar fasciotomy, gastroc anemias release and Topaz/PRP for the Achilles.  He is interested in pursuing this.  Currently his insurance is about to change carriers and he will return once he has his new insurance information to schedule surgery.   Return for when ready to plan surgery .

## 2021-12-03 NOTE — Telephone Encounter (Signed)
Fyi pt called upset that when he came in the for the appt yesterday someone had set up an interpreter and he feels he does not need one. He has been in several times without one and not sure why one was contacted for him.  I did apologize to pt and looked in chart and it does not say he needs an interpreter so I am not sure how one got sent over. He felt it was disrespectful and I did apologize again for that happening to him. Please in the future ask pt to see if he would like an interpreter for his appts. It is marked in the chart that he is not requesting the interpreter.

## 2021-12-22 DIAGNOSIS — E7800 Pure hypercholesterolemia, unspecified: Secondary | ICD-10-CM | POA: Insufficient documentation

## 2022-01-07 ENCOUNTER — Other Ambulatory Visit: Payer: Self-pay | Admitting: Podiatry

## 2022-01-14 ENCOUNTER — Ambulatory Visit: Payer: 59 | Admitting: Family Medicine

## 2022-02-03 ENCOUNTER — Ambulatory Visit: Payer: PRIVATE HEALTH INSURANCE | Admitting: Family Medicine

## 2022-02-03 ENCOUNTER — Telehealth: Payer: Self-pay | Admitting: Family Medicine

## 2022-02-03 NOTE — Telephone Encounter (Signed)
Called patient and listened to his complaints - The Language and interpreter issues, Money issues, etc. I did convey that Enriqueta Shutter had spoken with Interpreter services, that his language has been changed to [place] and no Interpreter is needed and that there was a note stating the same on his chart. He discussed the call to his soon to be ex-wife and assured him that she has been removed as a contact. He did understand the situation with Dr. Redmond Pulling and the family emergency. I expressed our desire to welcome him as a patient at [facility] and hope that he will come to his Oct. 6 appt.

## 2022-02-03 NOTE — Telephone Encounter (Signed)
Copied from [place] [id number]. Topic: Complaint - Care >> Feb 03, 2022  8:57 AM Devoria Glassing wrote: Date of Incident: 02/02/2022 Details of complaint: pt called upset over a few things.  His appt has been rescheduled 2 times.  He is taking this personally, saying he is being discriminated against, and the dr must not want to see him.  In addition, the interpretor called and left a message about his appt.  He says he does NOT need interpretor, he has lived in this [place] 40 yrs. He wanted to know who did this?  He is going to talk to his lawyer about this discrimination. I have updated his chart to reflect his [place] name. He asked for the number in which he could complain about all this, I gave him pt experience number.   In addition,someone called his wife trying to get in touch w/ him.  He said they are separated. No need to call her.  He said, "what are you trying to do, embarrass me?"  I have noted in his chart not to call her, they are separated. Because his appt has been rescheduled 2 times, he thinks the dr does not want to see him.  He is really upset over this appt reschedule.  I tried to explain the dr had emergency.  How would the patient like to see it resolved? Pt would like a call back, not sure how to resolve.   Route to Engineer, building services.

## 2022-02-13 ENCOUNTER — Ambulatory Visit: Payer: PRIVATE HEALTH INSURANCE | Admitting: Family Medicine

## 2022-03-12 ENCOUNTER — Ambulatory Visit: Payer: Commercial Managed Care - HMO | Admitting: Podiatry

## 2022-03-12 DIAGNOSIS — M7662 Achilles tendinitis, left leg: Secondary | ICD-10-CM | POA: Diagnosis not present

## 2022-03-12 DIAGNOSIS — M722 Plantar fascial fibromatosis: Secondary | ICD-10-CM | POA: Diagnosis not present

## 2022-03-12 DIAGNOSIS — M62462 Contracture of muscle, left lower leg: Secondary | ICD-10-CM

## 2022-03-12 MED ORDER — METHYLPREDNISOLONE 4 MG PO TBPK: ORAL_TABLET | ORAL | 0 refills | Status: DC

## 2022-03-12 MED ORDER — DICLOFENAC SODIUM 75 MG PO TBEC
75.0000 mg | DELAYED_RELEASE_TABLET | Freq: Two times a day (BID) | ORAL | 0 refills | Status: DC
Start: 2022-03-12 — End: 2022-03-30

## 2022-03-16 NOTE — Progress Notes (Signed)
Subjective:  Patient ID: Lance [name], male    DOB: [birth year],  MRN: [id number]  Chief Complaint  Patient presents with   Plantar Fasciitis    Left foot flare up    61 y.o. male presents with the above complaint. History confirmed with patient.  He still doing okay.  Still very painful.  Is interested in having this definitively treated.  Objective:  Physical Exam: warm, good capillary refill, no trophic changes or ulcerative lesions, normal DP and PT pulses, and normal sensory exam. Left Foot: point tenderness of the mid plantar fascia as well as the Achilles insertion, he does have gastrocnemius equinus on exam today Right Foot: Little to no pain  Left foot x-rays taken today show minimal degenerative changes no fracture and a small heel spur  Study Result  Narrative & Impression  CLINICAL DATA:  Plantar fasciitis.   EXAM: MR OF THE LEFT HEEL WITHOUT CONTRAST   TECHNIQUE: Multiplanar, multisequence MR imaging of the left ankle was performed. No intravenous contrast was administered.   COMPARISON:  Left foot radiographs 09/15/2021   FINDINGS: TENDONS   Peroneal: The peroneus longus and brevis tendons are intact.   Posteromedial: Mild intermediate T2 signal posterior tibial insertional tendinosis. Mild posterior tibial tenosynovitis. The flexor digitorum longus and flexor hallucis longus tendons are intact.   Anterior: The tibialis anterior, extensor hallucis longus, and extensor digitorum longus tendons are intact.   Achilles: Minimal intermediate T2 signal mid and distal Achilles tendinosis, predominantly within the anteromedial aspect. No fluid bright tear.   Plantar Fascia: Large medial plantar calcaneal heel spur. No calcaneal marrow edema. Mild chronic thickening of the medial band of the plantar fascia but no increased fluid sensitive signal at this time to indicate active plantar fasciitis.   LIGAMENTS   Lateral: Mild attenuation of the  anterior talofibular ligament, likely a remote partial-thickness tear. Probable chronic attenuation of the anterior tibiofibular ligament, remote partial-thickness tear. The posterior talofibular and tibiofibular ligaments are intact.   Medial: The tibiotalar deep deltoid and tibial spring ligaments are intact.   CARTILAGE   Ankle Joint: Mild tibiotalar cartilage thinning.   Subtalar Joints/Sinus Tarsi: Fat is preserved within sinus tarsi. Mild posterior subtalar cartilage thinning and Taylor sided subchondral marrow edema.   Bones: Mild-to-moderate talonavicular and navicular-cuneiform cartilage thinning. Moderate first tarsometatarsal cartilage thinning with mild-to-moderate base of the great toe metatarsal subchondral degenerative cyst. Moderate cartilage thinning throughout the rest of the tarsometatarsal joints.   Other: None.   IMPRESSION: 1. Large plantar calcaneal heel spur. Mild chronic thickening of the medial band of the plantar fascia but no increased fluid sensitive signal at this time to indicate active plantar fasciitis. 2. 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 3. Minimal mid and distal Achilles tendinosis. 4. Mild posterior tibial insertional tendinosis with mild tenosynovitis. 5. 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     Electronically Signed   By: Yvonne Kendall M.D.   On: 10/06/2021 22:25   Assessment:   1. Plantar fasciitis   2. Gastrocnemius equinus of left lower extremity   3. Achilles tendinitis, left leg       Plan:  Patient was evaluated and treated and all questions answered.  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  We discussed further treatment of this.  He does have a sizable calcaneal spur noted on his MRI.  We discussed resection of this but this would likely  require a larger incision  and the longer recovery time compared to endoscopic plantar fasciotomy.  He does have contribute factors of gastrocnemius equinus.  I discussed with him if we are to proceed with surgical intervention I would recommend a gastrocnemius recession as well as an endoscopic plantar fasciotomy.  If this is still unsuccessful then the heel spur could be removed at a later date.  For now we will continue to treat with anti-inflammatories methylprednisolone taper and diclofenac was sent to his pharmacy to begin the diclofenac following the methylprednisolone taper.  We discussed the risk benefits and potential complications of surgery including not limited to  pain, swelling, infection, scar, numbness which may be temporary or permanent, chronic pain, stiffness, nerve pain or damage, wound healing problems.  He wishes to proceed with surgery.  Informed consent was signed and reviewed.   Surgical plan:  Procedure: -Endoscopic plantar fasciotomy and gastrocnemius recession left with PRP injection of plantar fascia and Achilles tendon  Location: -[facility]  Anesthesia plan: -IV sedation with regional block  Postoperative pain plan: - Tylenol 1000 mg every 6 hours, ibuprofen 600 mg every 6 hours, gabapentin 300 mg every 8 hours x5 days, oxycodone 5 mg 1-2 tabs every 6 hours only as needed  DVT prophylaxis: -None required  WB Restrictions / DME needs: -WBAT in CAM boot   No follow-ups on file.

## 2022-03-30 ENCOUNTER — Telehealth: Payer: Self-pay

## 2022-03-30 MED ORDER — METHYLPREDNISOLONE 4 MG PO TBPK: ORAL_TABLET | ORAL | 0 refills | Status: DC

## 2022-03-30 MED ORDER — DICLOFENAC SODIUM 75 MG PO TBEC: 75.0000 mg | DELAYED_RELEASE_TABLET | Freq: Two times a day (BID) | ORAL | 0 refills | Status: DC

## 2022-03-30 NOTE — Addendum Note (Signed)
Addended byLilian Kapur, Manya Balash R on: 03/30/2022 11:59 AM   Modules accepted: Orders

## 2022-03-30 NOTE — Telephone Encounter (Signed)
Lance [name] called about his medications from 03/12/2022. He stated they were sent to the wrong pharmacy. Please send to [facility] on [address]. He also cancelled his surgery on 06/26/2022. He stated he wants a second opinion.

## 2022-04-19 ENCOUNTER — Other Ambulatory Visit: Payer: Self-pay | Admitting: Podiatry

## 2022-05-19 ENCOUNTER — Other Ambulatory Visit: Payer: Self-pay | Admitting: Podiatry

## 2022-06-11 ENCOUNTER — Telehealth: Payer: Self-pay | Admitting: *Deleted

## 2022-06-11 NOTE — Telephone Encounter (Signed)
Patient is seeing another foot doctor in [place],does not need prescription(diclofenac- not helping)any longer from our office, wanted to update the physician.

## 2022-07-02 ENCOUNTER — Encounter: Payer: Commercial Managed Care - HMO | Admitting: Podiatry

## 2022-07-16 ENCOUNTER — Encounter: Payer: Commercial Managed Care - HMO | Admitting: Podiatry

## 2022-08-06 ENCOUNTER — Encounter: Payer: Commercial Managed Care - HMO | Admitting: Podiatry

## 2023-06-23 LAB — COLOGUARD: COLOGUARD: NEGATIVE

## 2023-10-06 DIAGNOSIS — R7303 Prediabetes: Secondary | ICD-10-CM | POA: Insufficient documentation

## 2024-01-05 ENCOUNTER — Ambulatory Visit: Payer: Medicare (Managed Care) | Admitting: Internal Medicine

## 2024-02-03 ENCOUNTER — Ambulatory Visit: Payer: Self-pay | Admitting: Internal Medicine

## 2024-02-03 ENCOUNTER — Encounter: Payer: Self-pay | Admitting: Internal Medicine

## 2024-02-03 ENCOUNTER — Ambulatory Visit (INDEPENDENT_AMBULATORY_CARE_PROVIDER_SITE_OTHER): Payer: Medicare (Managed Care) | Admitting: Internal Medicine

## 2024-02-03 VITALS — BP 132/80 | HR 88 | Temp 98.9°F | Ht 68.0 in | Wt 201.8 lb

## 2024-02-03 DIAGNOSIS — E7800 Pure hypercholesterolemia, unspecified: Secondary | ICD-10-CM

## 2024-02-03 DIAGNOSIS — G4733 Obstructive sleep apnea (adult) (pediatric): Secondary | ICD-10-CM | POA: Diagnosis not present

## 2024-02-03 DIAGNOSIS — R7303 Prediabetes: Secondary | ICD-10-CM | POA: Diagnosis not present

## 2024-02-03 LAB — COMPREHENSIVE METABOLIC PANEL WITH GFR
ALT: 21 U/L (ref 0–53)
AST: 21 U/L (ref 0–37)
Albumin: 4.6 g/dL (ref 3.5–5.2)
Alkaline Phosphatase: 62 U/L (ref 39–117)
BUN: 15 mg/dL (ref 6–23)
CO2: 27 meq/L (ref 19–32)
Calcium: 9.8 mg/dL (ref 8.4–10.5)
Chloride: 102 meq/L (ref 96–112)
Creatinine, Ser: 0.9 mg/dL (ref 0.40–1.50)
GFR: 92.2 mL/min (ref >60.00)
Glucose, Bld: 91 mg/dL (ref 70–99)
Potassium: 4 meq/L (ref 3.5–5.1)
Sodium: 138 meq/L (ref 135–145)
Total Bilirubin: 0.4 mg/dL (ref 0.2–1.2)
Total Protein: 7.8 g/dL (ref 6.0–8.3)

## 2024-02-03 LAB — HEMOGLOBIN A1C: Hgb A1c MFr Bld: 6.5 % (ref 4.6–6.5)

## 2024-02-03 NOTE — Progress Notes (Signed)
 [facility] [address] [place] [address] Dept: [phone] Dept Fax: [phone]  New Patient Office Visit  Subjective:   Lance [name] [birth year] 02/03/2024  Chief Complaint  Patient presents with   Establish Care    Pt not fasting diabetic medication refill needed, back pain    HPI: Lance [name] presents today to establish care at [facility] [employer]. Introduced to Publishing rights manager role and practice setting.  All questions answered.  Concerns: See below   Discussed the use of AI scribe software for clinical note transcription with the patient, who gave verbal consent to proceed.  History of Present Illness   Lance [name] is a 63 year old male who presents to establish primary care.  He has a history of high cholesterol, managed with atorvastatin 20 mg daily. His prediabetes is currently managed with metformin. He is not fasting today.   He has a history of sleep apnea, which he feels has improved since having all his teeth removed and using dentures. Despite this, he wakes up every two hours at night and urinates three to four times. He has never used a CPAP machine and has not undergone a recent sleep study. He feels tired easily and wakes up feeling fatigued.    The following portions of the patient's history were reviewed and updated as appropriate: past medical history, past surgical history, family history, social history, allergies, medications, and problem list.   Patient Active Problem List   Diagnosis Date Noted   OSA (obstructive sleep apnea) 02/03/2024   Pre-diabetes 10/06/2023   Hypercholesterolemia 12/22/2021   Plantar fasciitis of left foot 08/31/2017   History reviewed. No pertinent past medical history. Past Surgical History:  Procedure Laterality Date   APPENDECTOMY     History reviewed. No pertinent family history.  Current  Outpatient Medications:    atorvastatin (LIPITOR) 20 MG tablet, atorvastatin 20 mg tablet  TAKE 1 TABLET BY MOUTH EVERYDAY AT BEDTIME, Disp: , Rfl:    metFORMIN (GLUCOPHAGE-XR) 500 MG 24 hr tablet, Take 500 mg by mouth daily with breakfast., Disp: , Rfl:  Allergies  Allergen Reactions   Meloxicam      Other reaction(s): dizziness     ROS: A complete ROS was performed with pertinent positives/negatives noted in the HPI. The remainder of the ROS are negative.   Objective:   Today's Vitals   02/03/24 1351  BP: 132/80  Pulse: 88  Temp: 98.9 F (37.2 C)  TempSrc: Temporal  SpO2: 99%  Weight: 201 lb 12.8 oz (91.5 kg)  Height: 5' 8 (1.727 m)    GENERAL: Well-appearing, in NAD. Well nourished.  SKIN: Pink, warm and dry. No rash, lesion, ulceration, or ecchymoses.  NECK: Trachea midline. Full ROM w/o pain or tenderness. No lymphadenopathy.  RESPIRATORY: Chest wall symmetrical. Respirations even and non-labored. Breath sounds clear to auscultation bilaterally.  CARDIAC: S1, S2 present, regular rate and rhythm. Peripheral pulses 2+ bilaterally.  EXTREMITIES: Without clubbing, cyanosis, or edema.  PSYCH/MENTAL STATUS: Alert, oriented x 3. Cooperative, appropriate mood and affect.   Health Maintenance Due  Topic Date Due   Medicare Annual Wellness (AWV)  Never done   Pneumococcal Vaccine: 50+ Years (1 of 1 - PCV) Never done   Zoster Vaccines- Shingrix (1 of 2) Never done   COVID-19 Vaccine (1 - 2024-25 season) Never done    No results found for any visits on 02/03/24.  Assessment & Plan:  Assessment and Plan  Obstructive sleep apnea Obstructive sleep apnea with nocturia and frequent awakenings. Discussed cardiovascular risks and benefits of diagnosis and treatment. - Refer for sleep study. - Follow up with sleep specialist.  Prediabetes Prediabetes managed with metformin. Monitoring A1c to evaluate control. - Check A1c and CMP today. - Continue metformin. - Reassess in  four months with fasting labs.  Hyperlipidemia Hyperlipidemia managed with atorvastatin. Cholesterol levels to be reassessed. - Continue atorvastatin. - Reassess cholesterol in four months with fasting labs.      Orders Placed This Encounter  Procedures   Hemoglobin A1C   Comp Met (CMET)   Ambulatory referral to Sleep Studies    Referral Priority:   Routine    Referral Type:   Consultation    Referral Reason:   Specialty Services Required    Number of Visits Requested:   1    Return in about 4 months (around 06/04/2024) for Chronic Condition follow up - fasting lab work at appointment. [name], FNP

## 2024-03-22 NOTE — Progress Notes (Signed)
 Lance [name], FNO - referral to [facility] - sleep clinic on 10-06-2023:   Alray Ike Caldron, MD  [address]  [address]  [place], [place] [address]  Phone: [phone]  [phone]  [facility] - SLEEP [facility] [place]  [address]  [place], [place] [address]  Phone: tel:[phone]  fax:[phone]     Chief Complaint  Patient presents with Sleep Apnea  Wakes up snoring and with dry mouth. Wakes up every two hours Fells tired during day.   History of Present Illness The patient is a 63 year old male who presents for sleep apnea concerns.  He reports frequent awakenings at night, approximately every 2 hours, accompanied by snoring and dry mouth. These symptoms have been persistent for over 6 months.   Upon awakening, he experiences fatigue and a sensation of weakness in his head, but does not report any morning headaches.  He has not been observed to have apneic episodes during sleep, as he typically sleeps alone.  He describes a choking sensation upon awakening, which is alleviated by deep breathing.   This sensation is not present during physical activity or exercise. STOP BANG is 5.  He is currently unemployed and engages in minimal home-based exercise.  He reports excessive daytime sleepiness, often feeling fatigued after only 20 minutes of driving.  He maintains adequate hydration, consuming 3 to 4 bottles of water daily. He also reports frequent daytime napping due to fatigue. His diet occasionally includes high-sodium foods.  He is not on any antihypertensive medications and does not have a history of hypertension.   Medications Current Outpatient Medications  Medication Instructions  atorvastatin (LIPITOR) 20 mg tablet TAKE 1 TABLET BY MOUTH EVERYDAY FOR HIGH CHOLESTEROL  ergocalciferol (VITAMIN D2) 1,250 mcg (50,000 unit) capsule  gabapentin (NEURONTIN) 100 mg capsule Take 1-3 capsules up to tid prn for pain.   guaiFENesin (MUCINEX) 1,200 mg, oral, 2 times daily  metFORMIN (GLUCOPHAGE-XR) 500 mg, oral, Daily with breakfast .        @[facility]   Provider:  Dedra Gores, MD  Primary Care Physician:  [name] Knee, FNP [address] [place] [address]  Referring Provider: Senters Knee, Fnp [address] [place],  [place] [address]        Chief Concern for this Consultation:   Patient presents with          HPI: I have the pleasure of meeting with Lance [name] , on 03/23/24 , who is a 63 y.o.  male patient,  seen upon a referral by Knee Senters, NP   for a  Sleep Medicine Consultation.  The patient's referral information asked for  Sleep apnea care .  I copied the primary care note as an addendum here today.  The patient states he never was tested for sleep apnea he has been told that he has a high risk of having sleep apnea partially due to his snoring and then he had a high stop bang score of 5.  He is 63 years old he is male his BMI is 31.3, and he has experienced some daytime fatigue and sleepiness. He feels this way for about 2 years. He is not aware of any medical reason or cause .he has gained weight , though , reached 230.   He wakes up every 2 hours and has trouble to sleep through, feels unrefreshed.    Chief concern according to patient:  I suspect that I may have sleep apnea and that is why I  came.  I would like to add that there is no past medical history on file. Sleep relevant medical/ surgical and symptom history: NO ENT surgery , he is edentulous, dentures, has GERD,   pre diabetic , not yet DM type 2.  Hypercholesterolemia .  Family medical history: There are  no known biological family members affected by Sleep apnea ( ),     Social history:Mr Strutz is retired from the [employer] ( [employer] ) /He  lives in a private home, in a household with wife and daughter and 1 dog .  This patient is a caretaker of his daughter .   The patient used to  work in shifts (late shifts 3-4 Pm to 12 midnight, 5 AM through 3 PM.    Nicotine use: /.  ETOH use: /,  Caffeine intake in form of: Coffee (1 day in AM ), Soft drinks (none ), Tea ( none ) no  Energy drinks ( including those containing  taurine ). Caffeine is last consumed at 10 AM.  Exercises regularly in form of walking .        Sleep habits and routines are as follows: The patient's dinner time is around 6 PM.  Evening time is spent with his wife and daughter . The patient goes to bed at, or close to, 9-10 PM. The bedroom is shared with wife  and is described as cool, quiet, and dark. He is snoring louder .  The patient reports that it takes 10 minutes to fall asleep, then continues to sleep for intervals of 2  hours, uninterrupted or woken up by the need to void (Nocturia) 2-3 times .   The preferred sleep position is supine or lateral , with support of 1-2 pillows, (non- adjustable bed/ ). The total estimated sleep time is circa 7-8  hours.  Dreams are reportedly in frequent/ and can be vivid=. Dream enactment has not been reported.   6 AM  AM is the usual week- day rise time. The patient wakes up spontaneously.  He reports not  feeling refreshed and restored in the morning, waking with symptoms such as dry mouth, sometimes morning headaches, shoulder stiffness or pain , and fatigue. No sleep paralysis has been experienced.  Naps in daytime are taken daily  (there is a desire to nap and opportunity), lasting from 3 Pm - 3.30 PM  and have a refreshing quality. These do not interfere with nocturnal sleep.    Review of Systems: Out of a complete 14 system review, the patient complains of only the following symptoms, and all other reviewed systems are negative.:  ESS 16/ 24  Hypersomnia : yes    Snoring. Sleep fragmentation, Nocturia   How likely are you to doze in the following situations: 0 = not likely, 1 = slight chance, 2 = moderate chance, 3 = high chance Sitting and  Reading? Watching Television? Sitting inactive in a public place (theater or meeting)? As a passenger in a car for an hour without a break? Lying down in the afternoon when circumstances permit? Sitting and talking to someone? Sitting quietly after lunch without alcohol? In a car, while stopped for a few minutes in traffic?   Total ESS =16 / 24 points.    FSS endorsed at 45/ 63 points.  GDS:  Social History   Socioeconomic History   Marital status: Married    Spouse name: Not on file   Number of children: Not on file   Years of education: Not  on file   Highest education level: Not on file  Occupational History   Not on file  Tobacco Use   Smoking status: Never   Smokeless tobacco: Never  Vaping Use   Vaping status: Not on file  Substance and Sexual Activity   Alcohol use: No   Drug use: No   Sexual activity: Not on file  Other Topics Concern   Not on file  Social History Narrative   Pt lives with family    Pt doesn't work    Social Drivers of Corporate Investment Banker Strain: Not on file  Food Insecurity: Low Risk  (10/06/2023)   Received from [facility]   Hunger Vital Sign    Within the past 12 months, you worried that your food would run out before you got money to buy more: Never true    Within the past 12 months, the food you bought just didn't last and you didn't have money to get more. : Never true  Transportation Needs: No Transportation Needs (10/06/2023)   Received from [facility]    In the past 12 months, has lack of reliable transportation kept you from medical appointments, meetings, work or from getting things needed for daily living? : No  Physical Activity: Not on file  Stress: Not on file  Social Connections: Unknown (04/10/2022)   Received from [facility]   Social Network    Social Network: Not on file    Family History  Problem Relation Age of Onset   Sleep apnea Neg Hx     History reviewed. No pertinent past  medical history.  Past Surgical History:  Procedure Laterality Date   APPENDECTOMY       Current Outpatient Medications on File Prior to Visit  Medication Sig Dispense Refill   atorvastatin (LIPITOR) 20 MG tablet atorvastatin 20 mg tablet  TAKE 1 TABLET BY MOUTH EVERYDAY AT BEDTIME     metFORMIN (GLUCOPHAGE-XR) 500 MG 24 hr tablet Take 500 mg by mouth daily with breakfast.     No current facility-administered medications on file prior to visit.    Allergies  Allergen Reactions   Meloxicam      Other reaction(s): dizziness     Vitals:   03/23/24 1059  BP: 126/77  Pulse: 92      Physical exam:   General: The patient was alert and appears not in acute distress.  Mood and affect are appropriate .  The patient's interactions are: Cooperative, makes eye contact, follows the instructions and answers questions coherently.  The patient is groomed and appropriately groomed and dressed. Head: Normocephalic, atraumatic.  Neck is supple. Mallampati: 3.  The neck circumference measured 16 inches. Nasal airflow was patent ,   Overbite / Retrognathia was noted.  Dental status:  edentulous  Cardiovascular:  Regular rate and cardiac rhythm by palpable pulse. Respiratory: no audible wheezing, no tachypnoea.   Skin:  Without evidence of ankle edema. No discoloration.  Trunk:  BMI is 31. 2 The patient's posture was erect.   Neurologic exam : The patient was awake and alert, oriented to place and time.   Attention span & concentration ability appeared normal.  Speech was fluent, without dysarthria, dysphonia or aphasia, and of normal volume.     Cranial nerves:  There was no loss of smell or taste reported  Pupils are round, equal in size and briskly reactive to light.  Funduscopic exam was deferred.  Extraocular movements in vertical and horizontal planes  were intact and without nystagmus. (No Diplopia reported). Visual fields by finger perimetry are intact. Hearing impaired,    Facial sensation intact to fine touch.  Facial motor strength: Symmetric movement and tongue and uvula move midline.  Neck ROM: rotation, tilt and flexion extension were intact for age and shoulder shrug was symmetrical.    Motor exam:  Symmetric bulk, strength and ROM.   Normal tone without cog- wheeling, and symmetric grip strength.   Sensory:  Fine touch is decreased in both feet and hands.  Proprioception tested in the upper extremities was normal.   Coordination: The patient reported no problems with button closure and no changes to penmanship, without evidence of ataxia, dysmetria or tremor.   Gait and station: Patient could rise unassisted from a seated position, without bracing.  No limp was noted.  Arm swing was preserved. The patient's gait posture was erect. .   Deep tendon reflexes: Upper extremities did show symmetric DTRs. Lower extremity DTRs were symmetric and brisk.   Babinski response was  normal    I would like to thank Billy Knee, FNP and Billy Knee, Fnp [address] [place],  [place] [address] for allowing me to meet with her patient-   In short,Mr S  is presenting with  excessive daytime sleepiness ( ESS 16/ 24 ) ,  nocturia, fragmented sleep- non restorative sleep and loud snoring in supine.   Risk factors for OSA were present,  including : Body mass index is 31.32 kg/m., and upper airway anatomy.  Also nocturia may have been promoted by higher blood glucose levels  with glucosuria. (  Hba1c was 6.5 )   Snoring may be louder due to weight gain, but also due to no longer having biological teeth.    He described  almost falling asleep driving and was warned to  avoid operating machinery when sleepy and tired.  )    My Plan is to proceed with:  HST/ PSG : either test will screen for sleep apnea, hypoxia and snoring.  I prefer a watch pat device over sansa if we test at home. .   I plan to follow up through NP  within  5-6 months.   A total  time of  45  minutes consistent of a part of face to face encounter , exam and interview,  and additional preparation time for chart review was spent .  At today's visit, we discussed treatment options, associated risk and benefits, and engage in counseling as needed including, but not limited to:  Sleep hygiene, Quality Sleep Habits, and Safety concerns for patients with daytime sleepiness who are warned to not operate machinery/ motor vehicles when drowsy. Risk factors for sleep apnea were identified:  STOP BANG was 5.   Additionally, the following were reviewed: Past medical records, past medical and surgical history, family and social background, as well as relevant laboratory results, imaging findings, and medical notes, where applicable.  This note was generated by myself in part by using dictation software, and as a result, it may contain unintentional typos and errors.  Nevertheless, effort was made to accurately convey the pertinent aspects of the patient's visit.   Dedra Gores, MD   [facility] Sleep Board certified in Sleep Medicine by The [employer] of Sleep Medicine and Diplomate of the [employer] (AASM) . Board certified In Neurology, Diplomat of the ABPN,  Fellow of the [employer] Neu[employer].

## 2024-03-23 ENCOUNTER — Encounter: Payer: Self-pay | Admitting: Neurology

## 2024-03-23 ENCOUNTER — Ambulatory Visit (INDEPENDENT_AMBULATORY_CARE_PROVIDER_SITE_OTHER): Payer: Medicare (Managed Care) | Admitting: Neurology

## 2024-03-23 VITALS — BP 126/77 | HR 92 | Ht 67.0 in | Wt 200.0 lb

## 2024-03-23 DIAGNOSIS — K08109 Complete loss of teeth, unspecified cause, unspecified class: Secondary | ICD-10-CM

## 2024-03-23 DIAGNOSIS — G4733 Obstructive sleep apnea (adult) (pediatric): Secondary | ICD-10-CM

## 2024-03-23 DIAGNOSIS — G478 Other sleep disorders: Secondary | ICD-10-CM

## 2024-03-23 DIAGNOSIS — G4719 Other hypersomnia: Secondary | ICD-10-CM

## 2024-03-23 DIAGNOSIS — R0681 Apnea, not elsewhere classified: Secondary | ICD-10-CM

## 2024-03-23 DIAGNOSIS — R0689 Other abnormalities of breathing: Secondary | ICD-10-CM | POA: Diagnosis not present

## 2024-03-23 DIAGNOSIS — K219 Gastro-esophageal reflux disease without esophagitis: Secondary | ICD-10-CM | POA: Diagnosis not present

## 2024-03-23 DIAGNOSIS — R351 Nocturia: Secondary | ICD-10-CM

## 2024-03-23 NOTE — Patient Instructions (Signed)
 n short,Lance [name]  is presenting with  excessive daytime sleepiness ( ESS 16/ 24 ) ,  nocturia, fragmented sleep- non restorative sleep and loud snoring in supine.   Risk factors for OSA were present,  including : Body mass index is 31.32 kg/m., and upper airway anatomy.  Also nocturia may have been promoted by higher blood glucose levels  with glucosuria. (  Hba1c was 6.5 )    Snoring may be louder due to weight gain, but also due to no longer having biological teeth.     He described  almost falling asleep driving and was warned to  avoid operating machinery when sleepy and tired.  )     My Plan is to proceed with:   HST/ PSG : either test will screen for sleep apnea, hypoxia and snoring.  I prefer a watch pat device over sansa if we test at home. .    I plan to follow up through NP  within  5-6 months.    A total time of  45  minutes consistent of a part of face to face encounter , exam and interview,  and additional preparation time for chart review was spent .  At today'[name] visit, we discussed treatment options, associated risk and benefits, and engage in counseling as needed including, but not limited to:  Sleep hygiene, Quality Sleep Habits, and Safety concerns for patients with daytime sleepiness who are warned to not operate machinery/ motor vehicles when drowsy. Risk factors for sleep apnea were identified:  STOP BANG was 5.    Additionally, the following were reviewed: Past medical records, past medical and surgical history, family and social background, as well as relevant laboratory results, imaging findings, and medical notes, where applicable.  This note was generated by myself in part by using dictation software, and as a result, it may contain unintentional typos and errors.  Nevertheless, effort was made to accurately convey the pertinent aspects of the patient'[name] visit.    Lance Gores, MD    [facility] Sleep Board certified in Sleep Medicine  by [employer] of Sleep Medicine and Diplomate of the [employer] (AASM) . Board certified In Neurology, Diplomat of the ABPN,  Fellow of the [employer].

## 2024-04-28 ENCOUNTER — Other Ambulatory Visit: Payer: Self-pay | Admitting: Internal Medicine

## 2024-04-28 NOTE — Telephone Encounter (Unsigned)
 Copied from CRM #[id number]. Topic: Clinical - Medication Refill >> Apr 28, 2024  3:23 PM Brittany M wrote: Medication: atorvastatin  (LIPITOR) 20 MG tablet ;  metFORMIN  (GLUCOPHAGE -XR) 500 MG 24 hr tablet  Has the patient contacted their pharmacy? Yes (Agent: If no, request that the patient contact the pharmacy for the refill. If patient does not wish to contact the pharmacy document the reason why and proceed with request.) (Agent: If yes, when and what did the pharmacy advise?)  This is the patient's preferred pharmacy:  [id number] [address], [place] - [address] [place] [address] Phone: [phone] Fax: [phone]  Is this the correct pharmacy for this prescription? Yes If no, delete pharmacy and type the correct one.   Has the prescription been filled recently? Yes  Is the patient out of the medication? Yes  Has the patient been seen for an appointment in the last year OR does the patient have an upcoming appointment? Yes  Can we respond through MyChart? Yes  Agent: Please be advised that Rx refills may take up to 3 business days. We ask that you follow-up with your pharmacy.

## 2024-05-01 MED ORDER — ATORVASTATIN CALCIUM 20 MG PO TABS: 20.0000 mg | ORAL_TABLET | Freq: Every day | ORAL | 1 refills | Status: AC

## 2024-05-01 MED ORDER — METFORMIN HCL ER 500 MG PO TB24: 500.0000 mg | ORAL_TABLET | Freq: Every day | ORAL | 1 refills | Status: AC

## 2024-05-03 IMAGING — MR MR HEEL *L* W/O CM
5 series · 40 of 40 positions shown · non-contrast
Comparison: Left foot radiographs 09/15/2021

CLINICAL DATA: Plantar fasciitis.

EXAM:
MR OF THE LEFT HEEL WITHOUT CONTRAST
TECHNIQUE: Multiplanar, multisequence MR imaging of the left ankle was
performed. No intravenous contrast was administered.

[Series 3: T2 fat-sat · axial · 3.0mm · 0.70mm/px · z∈[-116,+13]mm · 9 of 40 slices shown]
[im 1/40]
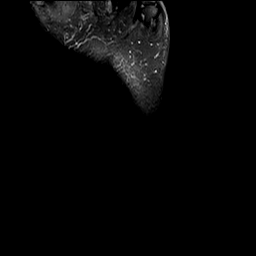
[im 5/40]
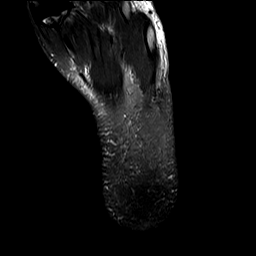
[im 10/40]
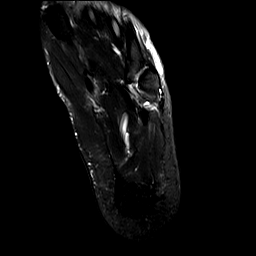
[im 15/40]
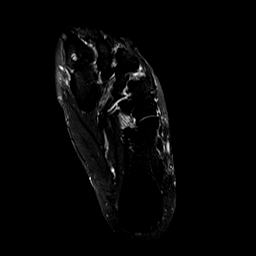
[im 20/40]
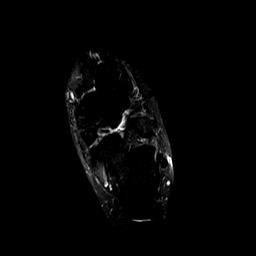
[im 25/40]
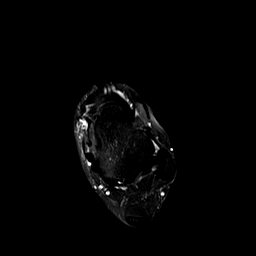
[im 30/40]
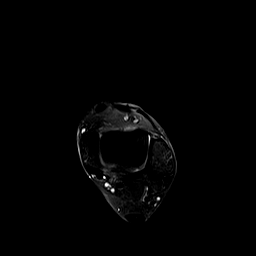
[im 35/40]
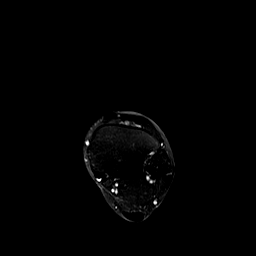
[im 40/40]
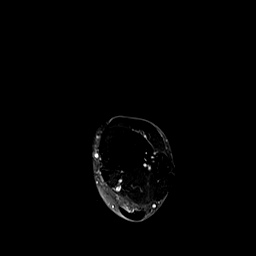

[Series 4: PD fat-sat · axial · 3.0mm · 0.70mm/px · z∈[-116,+13]mm · 9 of 40 slices shown]
[im 1/40]
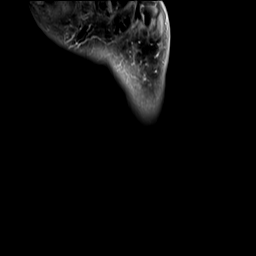
[im 5/40]
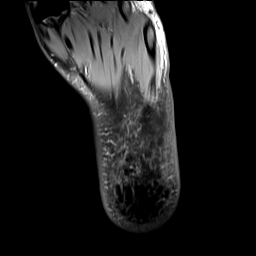
[im 10/40]
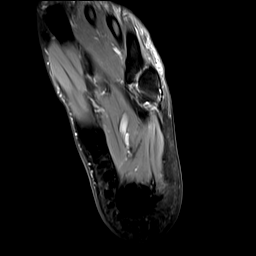
[im 15/40]
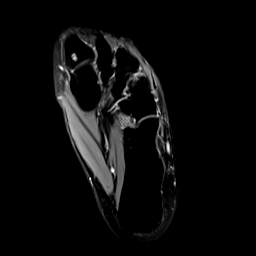
[im 20/40]
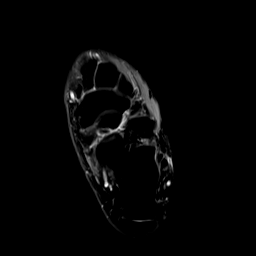
[im 25/40]
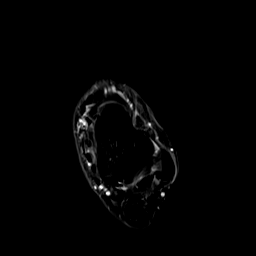
[im 30/40]
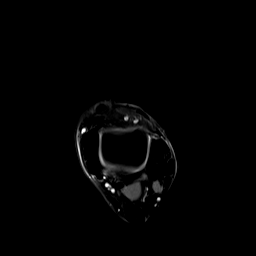
[im 35/40]
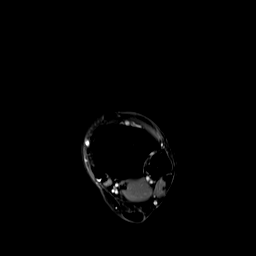
[im 40/40]
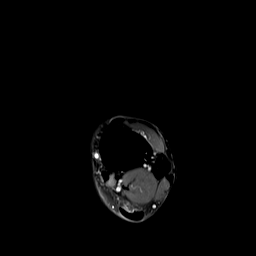

[Series 5: T1 · sagittal · 3.0mm · 0.56mm/px · 6 of 25 slices shown]
[im 1/25]
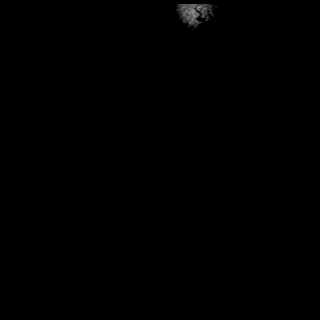
[im 5/25]
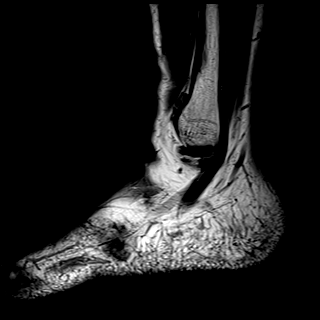
[im 10/25]
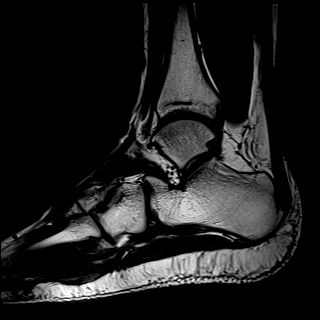
[im 15/25]
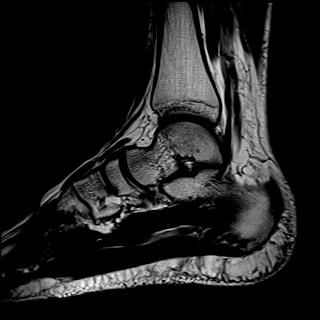
[im 20/25]
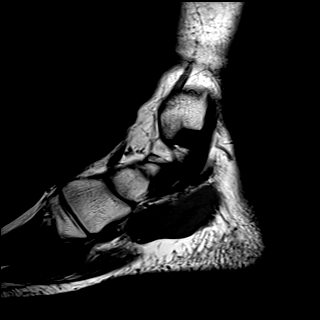
[im 25/25]
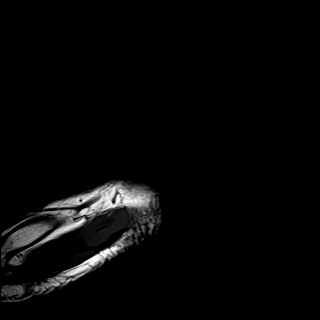

[Series 6: STIR · sagittal · 3.0mm · 0.70mm/px · 6 of 25 slices shown (1 of 2)]
[im 1/25]
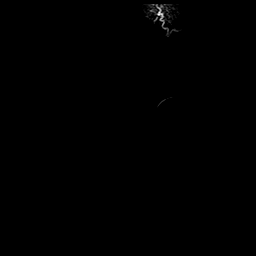
[im 5/25]
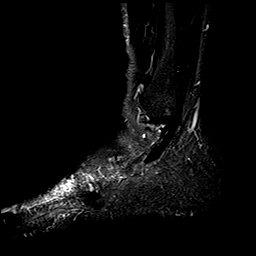
[im 10/25]
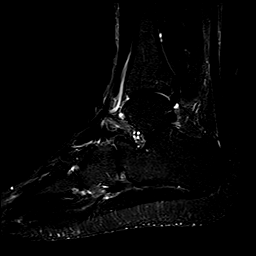
[im 15/25]
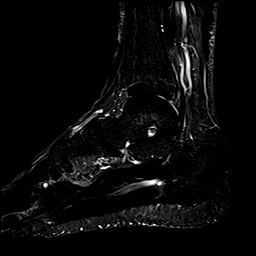
[im 20/25]
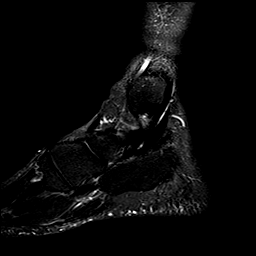
[im 25/25]
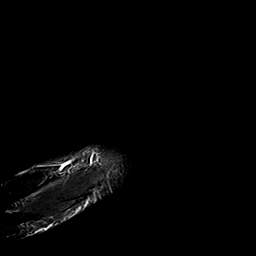

[Series 7: STIR · coronal · 3.0mm · 0.70mm/px · 10 of 46 slices shown (2 of 2)]
[im 1/46]
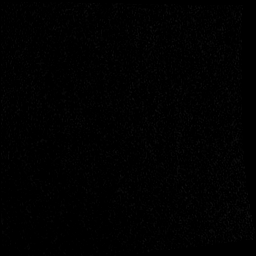
[im 6/46]
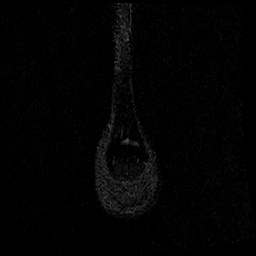
[im 11/46]
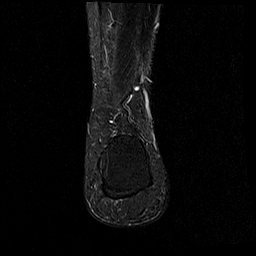
[im 16/46]
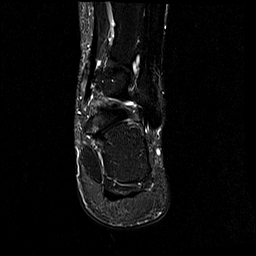
[im 21/46]
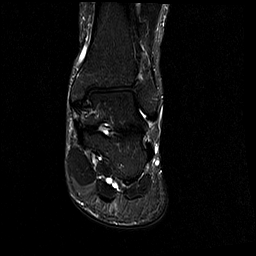
[im 26/46]
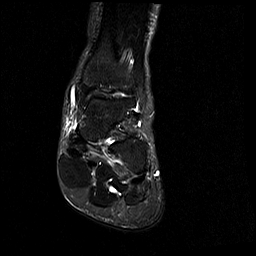
[im 31/46]
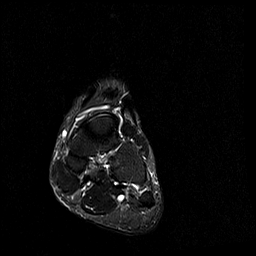
[im 36/46]
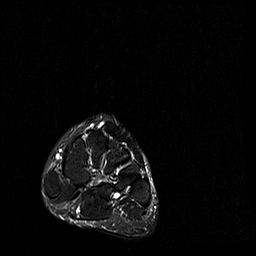
[im 41/46]
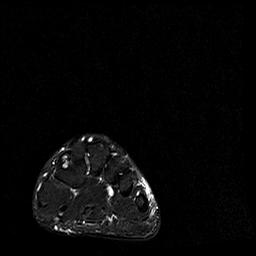
[im 46/46]
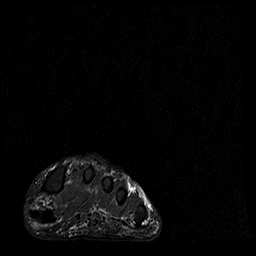

[40 of 40 positions shown; findings below may reference images not displayed]

FINDINGS: TENDONS

Peroneal: The peroneus longus and brevis tendons are intact.

Posteromedial: Mild intermediate T2 signal posterior tibial
insertional tendinosis. Mild posterior tibial tenosynovitis. The
flexor digitorum longus and flexor hallucis longus tendons are
intact.

Anterior: The tibialis anterior, extensor hallucis longus, and
extensor digitorum longus tendons are intact.

Achilles: Minimal intermediate T2 signal mid and distal Achilles
tendinosis, predominantly within the anteromedial aspect. No fluid
bright tear.

Plantar Fascia: Large medial plantar calcaneal heel spur. No
calcaneal marrow edema. Mild chronic thickening of the medial band
of the plantar fascia but no increased fluid sensitive signal at
this time to indicate active plantar fasciitis.

LIGAMENTS

Lateral: Mild attenuation of the anterior talofibular ligament,
likely a remote partial-thickness tear. Probable chronic attenuation
of the anterior tibiofibular ligament, remote partial-thickness
tear. The posterior talofibular and tibiofibular ligaments are
intact.

Medial: The tibiotalar deep deltoid and tibial spring ligaments are
intact.

CARTILAGE

Ankle Joint: Mild tibiotalar cartilage thinning.

Subtalar Joints/Sinus Tarsi: Fat is preserved within sinus tarsi.
Mild posterior subtalar cartilage thinning and Maria Magano sided
subchondral marrow edema.

Bones: Mild-to-moderate talonavicular and navicular-cuneiform
cartilage thinning. Moderate first tarsometatarsal cartilage
thinning with mild-to-moderate base of the great toe metatarsal
subchondral degenerative cyst. Moderate cartilage thinning
throughout the rest of the tarsometatarsal joints.

Other: None.
IMPRESSION: 1. Large plantar calcaneal heel spur. Mild chronic thickening of the
medial band of the plantar fascia but no increased fluid sensitive
signal at this time to indicate active plantar fasciitis.
2. Remote partial-thickness tears of the anterior talofibular and
tibiofibular ligaments.
3. Minimal mid and distal Achilles tendinosis.
4. Mild posterior tibial insertional tendinosis with mild
tenosynovitis.
5. Mild-to-moderate osteoarthritis as above.

## 2024-05-31 ENCOUNTER — Ambulatory Visit: Payer: PRIVATE HEALTH INSURANCE | Admitting: Neurology

## 2024-05-31 ENCOUNTER — Telehealth: Payer: Self-pay | Admitting: Neurology

## 2024-05-31 DIAGNOSIS — G478 Other sleep disorders: Secondary | ICD-10-CM

## 2024-05-31 DIAGNOSIS — R0681 Apnea, not elsewhere classified: Secondary | ICD-10-CM

## 2024-05-31 DIAGNOSIS — K08109 Complete loss of teeth, unspecified cause, unspecified class: Secondary | ICD-10-CM

## 2024-05-31 DIAGNOSIS — R0689 Other abnormalities of breathing: Secondary | ICD-10-CM

## 2024-05-31 DIAGNOSIS — R351 Nocturia: Secondary | ICD-10-CM

## 2024-05-31 DIAGNOSIS — G4719 Other hypersomnia: Secondary | ICD-10-CM

## 2024-05-31 DIAGNOSIS — G4710 Hypersomnia, unspecified: Secondary | ICD-10-CM | POA: Diagnosis not present

## 2024-05-31 DIAGNOSIS — G4711 Idiopathic hypersomnia with long sleep time: Secondary | ICD-10-CM

## 2024-05-31 NOTE — Telephone Encounter (Signed)
 Patient called to get instruction on how to pick up home sleep study device. Inform patient come the office and pull under the awning, ring the bell, someone come out with device and show you how to use it. Patient verbalized understand.

## 2024-06-02 NOTE — Progress Notes (Unsigned)
 SABRA

## 2024-06-07 ENCOUNTER — Ambulatory Visit: Payer: Self-pay | Admitting: Neurology

## 2024-06-07 NOTE — Procedures (Signed)
 "       [facility]  Lance [name] 64 year old male [birth year]   HOME SLEEP TEST REPORT ( by [facility] PAT)   STUDY DATE:   06-02-2024, from 05-31-2024     ORDERING CLINICIAN:  Dedra Gores, MD  REFERRING CLINICIAN: Rosina Senters, NP    CLINICAL INFORMATION/HISTORY:  patient was recently seen at [facility], 10-06-2023 and directed to sleep lab at [facility] , advised in  weight loss, reportedly snoring, BMI 34-35 , GERD, pre diabetes and at high risk for OSA-  he naas daily. He was  referred to sleep testing with a stated dx of OSA.   The patient's referral information asked for Sleep apnea care .   I copied the primary care note as an addendum here today: The patient states he never was tested for sleep apnea, he has been told that he has a high risk of having sleep apnea. This based on his snoring and high stop bang score of 5.   He is 64 years old, he is male, his BMI is now 31.3, and he has experienced some daytime fatigue and sleepiness.  He feels this way for about 2 years. He is not aware of any medical reason or cause . He has gained weight , though , reached 230 lbs.  By now he has lost 30 lbs.  He wakes up every 2 hours and has trouble to sleep through, feels unrefreshed.     Epworth sleepiness score: ESS =16 / 24 points.    FSS endorsed at 45/ 63 points.  GDS: X not provided by office staff.    BMI: 31.3 kg/m   Neck Circumference: 16       Sleep Summary:   Total Recording Time (hours, min):    11 h 48 minutes      Total Sleep Time (hours, min):  9 hours and 10 minutes                 Percent REM (%):     18%                                    Respiratory Indices ( AHI) :   Calculated pAHI (per AASM  guideline):   10.3/h,                      REM pAHI:   18.7/h , REM entered within less than 60 minutes ,                                               NREM pAHI:    8.5/h                            Positional AHI:   supine 11.5/h and non -supine  6.2/h    Snoring:  recorded only intermittent and very mild snoring ( 40 db is threshold of detection, this may have been audible breathing , not snoring)  Oxygen Saturation Statistics:   Oxygen Saturation (%) Mean:    94%           O2 Saturation Range (%):     between 82 and  99%                                 O2 Saturation (minutes) <89%:  1.6 minutes          Pulse Rate Statistics:   Pulse Mean (bpm):  73 bpm                Pulse Range:     54 through 102 bpm .             IMPRESSION:  This HST confirms the presence of  mild and obstructive sleep apnea without hypoxia.  There was a rather short REM sleep onset noted and  long sleep time.  During REM sleep,  the number of hypopneas and apneas increased.    RECOMMENDATION:  I recommend further weight loss and to start with core strength exercises.  CPAP should be initiated as well, mainly to assure that this patients extreme daytime sleepiness is either correlated to  apnea or not.   Starting on CPAP auto therapy by [employer] at 6 -14 cm water with 2 cm EPR and heated humidity, mask of choice and avoiding supine sleep as well .  Further work  up for hypersomnia may be needed if CPAP therapy does not reduce sleepiness. This may include narcolepsy  or idiopathic hypersomnia testing by [facility].    Any patient should be cautioned not to drive, work at heights, or operate dangerous or heavy equipment when tired or sleepy.   Review of good sleep hygiene measures is accessible to any sleep clinic patient and can be reiterated through online material- I we recommend the Guide to better Sleep   by the NIH.   Weight loss and Core Strength improvement is highly recommended for individuals with low muscle tone and/ or a BMI over 30.  Any CPAP patient should be reminded to be fully compliant with PAP therapy , (defined as using PAP therapy for more than 4 hours each night ) with the goal to improve sleep  related symptoms and decrease long term cardiovascular risks. Any PAP therapy patient should be reminded, that it may take up to 3 months to get fully used to using PAP and it may take 1-2 weeks for an established CPAP user to acclimatize to changes in pressure or mask. The earlier full compliance is achieved, the better long term compliance tends to be.   Please note that untreated obstructive sleep apnea may carry additional perioperative morbidity. Patients with significant obstructive sleep apnea should receive perioperative PAP therapy and the surgical team should be informed of the diagnosis and degree of sleep disordered breathing.  Sleep fragmentation in the presence of normal proportional sleep stages is a nonspecific findings and per se does not signify an intrinsic sleep disorder or a cause for the patient's sleep-related symptoms.  Causes include (but are not limited to) the unfamiliarity of sleeping while recorded by HST device or sleeping in a sleep lab for a full Polysomnography sleep study, but also circadian rhythm disturbances, medication side effects or an underlying mood disorder or medical problem.   The referring physician will be notified of the test results.       INTERPRETING PHYSICIAN:   Dedra Gores, MD  [facility] and [employer] certified by [employer] of Sleep Medicine and Diplomate of the [employer] of Sleep Medicine. Board certified In Neurology through the ABPN, Fellow of the [employer] of Neu[employer].                          "

## 2024-06-08 ENCOUNTER — Encounter: Payer: Self-pay | Admitting: Internal Medicine

## 2024-06-15 NOTE — Telephone Encounter (Signed)
 I called pt. I advised pt that Dr. Chalice reviewed their sleep study results and found that pt has mild sleep apnea. Dr. Chalice recommends that pt start CPAP therapy. I reviewed PAP compliance expectations with the pt. Pt is agreeable to starting a CPAP. I advised pt that an order will be sent to a DME, [employer], and [employer] will call the pt within about one week after they file with the pt's insurance. [employer] will show the pt how to use the machine, fit for masks, and troubleshoot the CPAP if needed. A follow up appt was made for insurance purposes with Greig Forbes on Sep 11, 2024. Pt verbalized understanding to arrive 15 minutes early and bring their CPAP. Pt verbalized understanding of results. Pt had no questions at this time but was encouraged to call back if questions arise. I have sent the order to [employer] and have received confirmation that they have received the order.

## 2024-06-15 NOTE — Telephone Encounter (Signed)
 SABRA

## 2024-06-15 NOTE — Telephone Encounter (Signed)
-----   Message from Dedra Gores, MD sent at 06/07/2024  5:38 PM EST ----- IMPRESSION:  This HST confirms the presence of  mild and obstructive sleep apnea without hypoxia.  There was a rather short REM sleep onset noted and  long sleep time.  During REM sleep,  the number  of hypopneas and apneas increased.  The patient had endorsed a long sleep time and still excessively high sleepiness.   Plan : start with CPAP therapy ( I ordered DME  auto CPAP ) and see if there is a good response ( less sleepiness) if sleepiness persists, will need to pursue other  differential dx.

## 2024-09-11 ENCOUNTER — Ambulatory Visit: Payer: Medicare (Managed Care) | Admitting: Family Medicine
